# Patient Record
Sex: Female | Born: 1955
Health system: Southern US, Community
[De-identification: ages and names within clinical notes are randomized; demographics above are authoritative.]

## PROBLEM LIST (undated history)

## (undated) DIAGNOSIS — C801 Malignant (primary) neoplasm, unspecified: Secondary | ICD-10-CM

## (undated) DIAGNOSIS — T7840XA Allergy, unspecified, initial encounter: Secondary | ICD-10-CM

## (undated) DIAGNOSIS — H269 Unspecified cataract: Secondary | ICD-10-CM

## (undated) DIAGNOSIS — E785 Hyperlipidemia, unspecified: Secondary | ICD-10-CM

## (undated) DIAGNOSIS — M8580 Other specified disorders of bone density and structure, unspecified site: Secondary | ICD-10-CM

## (undated) HISTORY — DX: Other specified disorders of bone density and structure, unspecified site: M85.80

## (undated) HISTORY — PX: POLYPECTOMY: SHX149

## (undated) HISTORY — PX: BREAST BIOPSY: SHX20

## (undated) HISTORY — DX: Hyperlipidemia, unspecified: E78.5

## (undated) HISTORY — DX: Allergy, unspecified, initial encounter: T78.40XA

## (undated) HISTORY — DX: Malignant (primary) neoplasm, unspecified: C80.1

## (undated) HISTORY — PX: SKIN CANCER EXCISION: SHX779

## (undated) HISTORY — PX: BREAST CYST ASPIRATION: SHX578

## (undated) HISTORY — PX: COLONOSCOPY: SHX174

## (undated) HISTORY — DX: Unspecified cataract: H26.9

---
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
## 1999-10-17 HISTORY — PX: NASAL SINUS SURGERY: SHX719

## 2000-02-27 ENCOUNTER — Other Ambulatory Visit: Admission: RE | Admit: 2000-02-27 | Discharge: 2000-02-27 | Payer: Self-pay | Admitting: Otolaryngology

## 2004-10-16 DIAGNOSIS — C801 Malignant (primary) neoplasm, unspecified: Secondary | ICD-10-CM

## 2004-10-16 HISTORY — DX: Malignant (primary) neoplasm, unspecified: C80.1

## 2011-10-31 ENCOUNTER — Encounter: Payer: Self-pay | Admitting: Family Medicine

## 2011-11-03 LAB — HM MAMMOGRAPHY: HM Mammogram: NORMAL

## 2011-11-14 ENCOUNTER — Encounter: Payer: Self-pay | Admitting: Family Medicine

## 2011-11-14 ENCOUNTER — Other Ambulatory Visit (HOSPITAL_COMMUNITY)
Admission: RE | Admit: 2011-11-14 | Discharge: 2011-11-14 | Disposition: A | Discharge status: Home / Self Care | Payer: BC Managed Care – PPO | Source: Ambulatory Visit | Attending: Family Medicine | Admitting: Family Medicine

## 2011-11-14 ENCOUNTER — Ambulatory Visit (INDEPENDENT_AMBULATORY_CARE_PROVIDER_SITE_OTHER): Payer: BC Managed Care – PPO | Admitting: Family Medicine

## 2011-11-14 VITALS — BP 114/68 | HR 57 | Temp 98.0°F | Ht 64.5 in | Wt 137.8 lb

## 2011-11-14 DIAGNOSIS — Z0000 Encounter for general adult medical examination without abnormal findings: Secondary | ICD-10-CM

## 2011-11-14 DIAGNOSIS — Z8371 Family history of colonic polyps: Secondary | ICD-10-CM

## 2011-11-14 DIAGNOSIS — C4491 Basal cell carcinoma of skin, unspecified: Secondary | ICD-10-CM

## 2011-11-14 DIAGNOSIS — N841 Polyp of cervix uteri: Secondary | ICD-10-CM

## 2011-11-14 DIAGNOSIS — Z01419 Encounter for gynecological examination (general) (routine) without abnormal findings: Secondary | ICD-10-CM | POA: Insufficient documentation

## 2011-11-14 LAB — BASIC METABOLIC PANEL
Calcium: 9.7 mg/dL (ref 8.4–10.5)
Creatinine, Ser: 0.8 mg/dL (ref 0.4–1.2)
GFR: 83.85 mL/min (ref >60.00)

## 2011-11-14 LAB — HEPATIC FUNCTION PANEL
ALT: 12 U/L (ref 0–35)
Alkaline Phosphatase: 41 U/L (ref 39–117)
Bilirubin, Direct: 0 mg/dL (ref 0.0–0.3)
Total Protein: 7.4 g/dL (ref 6.0–8.3)

## 2011-11-14 LAB — CBC WITH DIFFERENTIAL/PLATELET
Basophils Relative: 0.9 % (ref 0.0–3.0)
Eosinophils Relative: 0.8 % (ref 0.0–5.0)
Lymphocytes Relative: 28.9 % (ref 12.0–46.0)
Neutrophils Relative %: 57.7 % (ref 43.0–77.0)
RBC: 4.36 Mil/uL (ref 3.87–5.11)
WBC: 4.5 10*3/uL (ref 4.5–10.5)

## 2011-11-14 LAB — LIPID PANEL
HDL: 96.4 mg/dL (ref >39.00)
Total CHOL/HDL Ratio: 3

## 2011-11-14 LAB — TSH: TSH: 2.08 u[IU]/mL (ref 0.35–5.50)

## 2011-11-14 NOTE — Progress Notes (Signed)
Subjective:     Kaitlyn Perez is a 56 y.o. female and is here for a comprehensive physical exam. The patient reports problems - pt describes several episodes of back catching when bending over.  It resolves on its own but takes several weeks. Marland Kitchen  History   Social History  . Marital Status: Unknown    Spouse Name: Joelynn Dust    Number of Children: N/A  . Years of Education: 46   Occupational History  . exec VP [employer]   Social History Main Topics  . Smoking status: Never Smoker   . Smokeless tobacco: Never Used  . Alcohol Use: 0.0 oz/week     rare--1 x a month  . Drug Use: No  . Sexually Active: Yes -- Female partner(s)   Other Topics Concern  . Not on file   Social History Narrative  . No narrative on file   No health maintenance topics applied.  The following portions of the patient's history were reviewed and updated as appropriate: allergies, current medications, past family history, past medical history, past social history, past surgical history and problem list.  Review of Systems Review of Systems  Constitutional: Negative for activity change, appetite change and fatigue.  HENT: Negative for hearing loss, congestion, tinnitus and ear discharge.  dentist q81m---+ braces Eyes: Negative for visual disturbance (see optho q1y -- vision corrected to 20/20 with glasses).  Respiratory: Negative for cough, chest tightness and shortness of breath.   Cardiovascular: Negative for chest pain, palpitations and leg swelling.  Gastrointestinal: Negative for abdominal pain, diarrhea, constipation and abdominal distention.  Genitourinary: Negative for urgency, frequency, decreased urine volume and difficulty urinating.  Musculoskeletal: Negative for back pain, arthralgias and gait problem.  Skin: Negative for color change, pallor and rash.  Neurological: Negative for dizziness, light-headedness, numbness and headaches.  Hematological: Negative for adenopathy. Does not  bruise/bleed easily.  Psychiatric/Behavioral: Negative for suicidal ideas, confusion, sleep disturbance, self-injury, dysphoric mood, decreased concentration and agitation.       Objective:    BP 114/68  Pulse 57  Temp(Src) 98 F (36.7 C) (Oral)  Ht 5' 4.5" (1.638 m)  Wt 137 lb 12.8 oz (62.506 kg)  BMI 23.29 kg/m2  SpO2 98% General appearance: alert, cooperative, appears stated age and no distress Head: Normocephalic, without obvious abnormality, atraumatic Eyes: conjunctivae/corneas clear. PERRL, EOM's intact. Fundi benign. Ears: normal TM's and external ear canals both ears Nose: Nares normal. Septum midline. Mucosa normal. No drainage or sinus tenderness. Throat: lips, mucosa, and tongue normal; teeth and gums normal Neck: no adenopathy, no carotid bruit, no JVD, supple, symmetrical, trachea midline and thyroid not enlarged, symmetric, no tenderness/mass/nodules Back: symmetric, no curvature. ROM normal. No CVA tenderness. Lungs: clear to auscultation bilaterally Breasts: normal appearance, no masses or tenderness Heart: regular rate and rhythm, S1, S2 normal, no murmur, click, rub or gallop Abdomen: soft, non-tender; bowel sounds normal; no masses,  no organomegaly Pelvic: cervix normal in appearance, external genitalia normal, no adnexal masses or tenderness, no cervical motion tenderness, rectovaginal septum normal, uterus normal size, shape, and consistency, vagina normal without discharge and + cervical polyp removed with ring forceps Extremities: extremities normal, atraumatic, no cyanosis or edema Pulses: 2+ and symmetric Skin: Skin color, texture, turgor normal. No rashes or lesions Lymph nodes: Cervical, supraclavicular, and axillary nodes normal. Neurologic: Alert and oriented X 3, normal strength and tone. Normal symmetric reflexes. Normal coordination and gait psych-- no anxiety or depression    Assessment:  Healthy female exam.  Cervical polyp--- check US  pelvis,  Sent for path     Plan:  ghm utd Check fasting labs   See After Visit Summary for Counseling Recommendations

## 2011-11-14 NOTE — Patient Instructions (Addendum)
Preventative Care for Adults, Female A healthy lifestyle and preventative care can promote health and wellness. Preventative health guidelines for women include the following key practices:  A routine yearly physical is a good way to check with your caregiver about your health and preventative screening. It is a chance to share any concerns and updates on your health, and to receive a thorough exam.   Visit your dentist for a routine exam and preventative care every 6 months. Brush your teeth twice a day and floss once a day. Good oral hygiene prevents tooth decay and gum disease.   The frequency of eye exams is based on your age, health, family medical history, use of contact lenses, and other factors. Follow your caregiver's recommendations for frequency of eye exams.   Eat a healthy diet. Foods like vegetables, fruits, whole grains, low-fat dairy products, and lean protein foods contain the nutrients you need without too many calories. Decrease your intake of foods high in solid fats, added sugars, and salt. Eat the right amount of calories for you.Get information about a proper diet from your caregiver, if necessary.   Regular physical exercise is one of the most important things you can do for your health. Most adults should get at least 150 minutes of moderate-intensity exercise (any activity that increases your heart rate and causes you to sweat) each week. In addition, most adults need muscle-strengthening exercises on 2 or more days a week.   Maintain a healthy weight. The body mass index (BMI) is a screening tool to identify possible weight problems. It provides an estimate of body fat based on height and weight. Your caregiver can help determine your BMI, and can help you achieve or maintain a healthy weight.For adults 20 years and older:   A BMI below 18.5 is considered underweight.   A BMI of 18.5 to 24.9 is normal.   A BMI of 25 to 29.9 is considered overweight.   A BMI of 30 and  above is considered obese.   Maintain normal blood lipids and cholesterol levels by exercising and minimizing your intake of saturated fat. Eat a balanced diet with plenty of fruit and vegetables. Blood tests for lipids and cholesterol should begin at age 20 and be repeated every 5 years. If your lipid or cholesterol levels are high, you are over 50, or you are a high risk for heart disease, you may need your cholesterol levels checked more frequently.Ongoing high lipid and cholesterol levels should be treated with medicines if diet and exercise are not effective.   If you smoke, find out from your caregiver how to quit. If you do not use tobacco, do not start.   If you are pregnant, do not drink alcohol. If you are breastfeeding, be very cautious about drinking alcohol. If you are not pregnant and choose to drink alcohol, do not exceed 1 drink per day. One drink is considered to be 12 ounces (355 mL) of beer, 5 ounces (148 mL) of wine, or 1.5 ounces (44 mL) of liquor.   Avoid use of street drugs. Do not share needles with anyone. Ask for help if you need support or instructions about stopping the use of drugs.   High blood pressure causes heart disease and increases the risk of stroke. Your blood pressure should be checked at least every 1 to 2 years. Ongoing high blood pressure should be treated with medicines if weight loss and exercise are not effective.   If you are 55 to 56   years old, ask your caregiver if you should take aspirin to prevent strokes.   Diabetes screening involves taking a blood sample to check your fasting blood sugar level. This should be done once every 3 years, after age 45, if you are within normal weight and without risk factors for diabetes. Testing should be considered at a younger age or be carried out more frequently if you are overweight and have at least 1 risk factor for diabetes.   Breast cancer screening is essential preventative care for women. You should  practice "breast self-awareness." This means understanding the normal appearance and feel of your breasts and may include breast self-examination. Any changes detected, no matter how small, should be reported to a caregiver. Women in their 20s and 30s should have a clinical breast exam (CBE) by a caregiver as part of a regular health exam every 1 to 3 years. After age 40, women should have a CBE every year. Starting at age 40, women should consider having a mammogram (breast X-ray) every year. Women who have a family history of breast cancer should talk to their caregiver about genetic screening. Women at a high risk of breast cancer should talk to their caregiver about having an MRI and a mammogram every year.   The Pap test is a screening test for cervical cancer. A Pap test can show cell changes on the cervix that might become cervical cancer if left untreated. A Pap test is a procedure in which cells are obtained and examined from the lower end of the uterus (cervix).   Women should have a Pap test starting at age 21.   Between ages 21 and 29, Pap tests should be repeated every 2 years.   Beginning at age 30, you should have a Pap test every 3 years as long as the past 3 Pap tests have been normal.   Some women have medical problems that increase the chance of getting cervical cancer. Talk to your caregiver about these problems. It is especially important to talk to your caregiver if a new problem develops soon after your last Pap test. In these cases, your caregiver may recommend more frequent screening and Pap tests.   The above recommendations are the same for women who have or have not gotten the vaccine for human papillomavirus (HPV).   If you had a hysterectomy for a problem that was not cancer or a condition that could lead to cancer, then you no longer need Pap tests. Even if you no longer need a Pap test, a regular exam is a good idea to make sure no other problems are starting.   If you  are between ages 65 and 70, and you have had normal Pap tests going back 10 years, you no longer need Pap tests. Even if you no longer need a Pap test, a regular exam is a good idea to make sure no other problems are starting.   If you have had past treatment for cervical cancer or a condition that could lead to cancer, you need Pap tests and screening for cancer for at least 20 years after your treatment.   If Pap tests have been discontinued, risk factors (such as a new sexual partner) need to be reassessed to determine if screening should be resumed.   The HPV test is an additional test that may be used for cervical cancer screening. The HPV test looks for the virus that can cause the cell changes on the cervix. The cells collected   during the Pap test can be tested for HPV. The HPV test could be used to screen women aged 30 years and older, and should be used in women of any age who have unclear Pap test results. After the age of 30, women should have HPV testing at the same frequency as a Pap test.   Colorectal cancer can be detected and often prevented. Most routine colorectal cancer screening begins at the age of 50 and continues through age 75. However, your caregiver may recommend screening at an earlier age if you have risk factors for colon cancer. On a yearly basis, your caregiver may provide home test kits to check for hidden blood in the stool. Use of a small camera at the end of a tube, to directly examine the colon (sigmoidoscopy or colonoscopy), can detect the earliest forms of colorectal cancer. Talk to your caregiver about this at age 50, when routine screening begins. Direct examination of the colon should be repeated every 5 to 10 years through age 75, unless early forms of pre-cancerous polyps or small growths are found.   Practice safe sex. Use condoms and avoid high-risk sexual practices to reduce the spread of sexually transmitted infections (STIs). STIs include gonorrhea,  chlamydia, syphilis, trichomonas, herpes, HPV, and human immunodeficiency virus (HIV). Herpes, HIV, and HPV are viral illnesses that have no cure. They can result in disability, cancer, and death. Sexually active women aged 25 and younger should be checked for Chlamydia. Older women with new or multiple partners should also be tested for Chlamydia. Testing for other STIs is recommended if you are sexually active and at increased risk.   Osteoporosis is a disease in which the bones lose minerals and strength with aging. This can result in serious bone fractures. The risk of osteoporosis can be identified using a bone density scan. Women ages 65 and over and women at risk for fractures or osteoporosis should discuss screening with their caregivers. Ask your caregiver whether you should take a calcium supplement or vitamin D to reduce the rate of osteoporosis.   Menopause can be associated with physical symptoms and risks. Hormone replacement therapy is available to decrease symptoms and risks. You should talk to your caregiver about whether hormone replacement therapy is right for you.   Use sunscreen with skin protection factor (SPF) of 30 or more. Apply sunscreen liberally and repeatedly throughout the day. You should seek shade when your shadow is shorter than you. Protect yourself by wearing long sleeves, pants, a wide-brimmed hat, and sunglasses year round, whenever you are outdoors.   Once a month, do a whole body skin exam, using a mirror to look at the skin on your back. Notify your caregiver of new moles, moles that have irregular borders, moles that are larger than a pencil eraser, or moles that have changed in shape or color.   Stay current with required immunizations.   Influenza. You need a dose every fall (or winter). The composition of the flu vaccine changes each year, so being vaccinated once is not enough.   Pneumococcal polysaccharide. You need 1 to 2 doses if you smoke cigarettes or  if you have certain chronic medical conditions. You need 1 dose at age 65 (or older) if you have never been vaccinated.   Tetanus, diphtheria, pertussis (Tdap, Td). Get 1 dose of Tdap vaccine if you are younger than age 65 years, are over 65 and have contact with an infant, are a healthcare worker, are pregnant, or simply want   to be protected from whooping cough. After that, you need a Td booster dose every 10 years. Consult your caregiver if you have not had at least 3 tetanus and diphtheria-containing shots sometime in your life or have a deep or dirty wound.   HPV. You need this vaccine if you are a woman age 26 years or younger. The vaccine is given in 3 doses over 6 months.   Measles, mumps, rubella (MMR). You need at least 1 dose of MMR if you were born in 1957 or later. You may also need a 2nd dose.   Meningococcal. If you are age 19 to 21 years and a first-year college student living in a residence hall, or have one of several medical conditions, you need to get vaccinated against meningococcal disease. You may also need additional booster doses.   Zoster (shingles). If you are age 60 years or older, you should get this vaccine.   Varicella (chickenpox). If you have never had chickenpox or you were vaccinated but received only 1 dose, talk to your caregiver to find out if you need this vaccine.   Hepatitis A. You need this vaccine if you have a specific risk factor for hepatitis A virus infection or you simply wish to be protected from this disease. The vaccine is usually given as 2 doses, 6 to 18 months apart.   Hepatitis B. You need this vaccine if you have a specific risk factor for hepatitis B virus infection or you simply wish to be protected from this disease. The vaccine is given in 3 doses, usually over 6 months.  Preventative Services / Frequency Ages 19 to 39  Blood pressure check.** / Every 1 to 2 years.   Lipid and cholesterol check.**/ Every 5 years beginning at age 20.    Clinical breast exam.** / Every 3 years for women in their 20s and 30s.   Pap Test.** / Every 2 years from ages 21 through 29. Every 3 years starting at age 30 years through age 65 or 70 with a history of 3 consecutive normal Pap tests.   HPV Screening.** / Every 3 years from ages 30 through ages 65 to 70 with a history of 3 consecutive normal Pap tests.   Skin self-exam. / Monthly.   Influenza immunization.** / Every year.   Pneumococcal polysaccharide immunization.** / 1 to 2 doses if you smoke cigarettes or if you have certain chronic medical conditions.   Tetanus, diphtheria, pertussis (Tdap,Td) immunization. / A one-time dose of Tdap vaccine. After that, you need a Td booster dose every 10 years.   HPV immunization. / 3 doses over 6 months, if 26 and younger.   Measles, mumps, rubella (MMR) immunization. / You need at least 1 dose of MMR if you were born in 1957 or later. You may also need a 2nd dose.   Meningococcal immunization. / 1 dose if you are age 19 to 21 years and a first-year college student living in a residence hall, or have one of several medical conditions, you need to get vaccinated against meningococcal disease. You may also need additional booster doses.   Varicella immunization. **/ Consult your caregiver.   Hepatitis A immunization. ** / Consult your caregiver. 2 doses, 6 to 18 months apart.   Hepatitis B immunization.** / Consult your caregiver. 3 doses usually over 6 months.  Ages 40 to 64  Blood pressure check.** / Every 1 to 2 years.   Lipid and cholesterol check.**/ Every 5 years beginning   at age 20.   Clinical breast exam.** / Every year after age 40.   Mammogram.** / Every year beginning at age 40 and continuing for as long as you are in good health. Consult with your caregiver.   Pap Test.** / Every 3 years starting at age 30 years through age 65 or 70 with a history of 3 consecutive normal Pap tests.   HPV Screening.** / Every 3 years from  ages 30 through ages 65 to 70 with a history of 3 consecutive normal Pap tests.   Fecal occult blood test (FOBT) of stool. / Every year beginning at age 50 and continuing until age 75. You may not have to do this test if you get colonoscopy every 10 years.   Flexible sigmoidoscopy** or colonoscopy.** / Every 5 years for a flexible sigmoidoscopy or every 10 years for a colonoscopy beginning at age 50 and continuing until age 75.   Skin self-exam. / Monthly.   Influenza immunization.** / Every year.   Pneumococcal polysaccharide immunization.** / 1 to 2 doses if you smoke cigarettes or if you have certain chronic medical conditions.   Tetanus, diphtheria, pertussis (Tdap/Td) immunization.** / A one-time dose of Tdap vaccine. After that, you need a Td booster dose every 10 years.   Measles, mumps, rubella (MMR) immunization. / You need at least 1 dose of MMR if you were born in 1957 or later. You may also need a 2nd dose.   Varicella immunization. **/ Consult your caregiver.   Meningococcal immunization.** / Consult your caregiver.     Hepatitis A immunization. ** / Consult your caregiver. 2 doses, 6 to 18 months apart.   Hepatitis B immunization.** / Consult your caregiver. 3 doses, usually over 6 months.  Ages 65 and over  Blood pressure check.** / Every 1 to 2 years.   Lipid and cholesterol check.**/ Every 5 years beginning at age 20.   Clinical breast exam.** / Every year after age 40.   Mammogram.** / Every year beginning at age 40 and continuing for as long as you are in good health. Consult with your caregiver.   Pap Test,** / Every 3 years starting at age 30 years through age 65 or 70 with a 3 consecutive normal Pap tests. Testing can be stopped between 65 and 70 with 3 consecutive normal Pap tests and no abnormal Pap or HPV tests in the past 10 years.   HPV Screening.** / Every 3 years from ages 30 through ages 65 or 70 with a history of 3 consecutive normal Pap tests.  Testing can be stopped between 65 and 70 with 3 consecutive normal Pap tests and no abnormal Pap or HPV tests in the past 10 years.   Fecal occult blood test (FOBT) of stool. / Every year beginning at age 50 and continuing until age 75. You may not have to do this test if you get colonoscopy every 10 years.   Flexible sigmoidoscopy** or colonoscopy.** / Every 5 years for a flexible sigmoidoscopy or every 10 years for a colonoscopy beginning at age 50 and continuing until age 75.   Osteoporosis screening.** / A one-time screening for women ages 65 and over and women at risk for fractures or osteoporosis.   Skin self-exam. / Monthly.   Influenza immunization.** / Every year.   Pneumococcal polysaccharide immunization.** / 1 dose at age 65 (or older) if you have never been vaccinated.   Tetanus, diphtheria, pertussis (Tdap, Td) immunization. / A one-time dose of Tdap   vaccine if you are over 65 and have contact with an infant, are a Research scientist (physical sciences), or simply want to be protected from whooping cough. After that, you need a Td booster dose every 10 years.   Varicella immunization. **/ Consult your caregiver.   Meningococcal immunization.** / Consult your caregiver.   Hepatitis A immunization. ** / Consult your caregiver. 2 doses, 6 to 18 months apart.   Hepatitis B immunization.** / Check with your caregiver. 3 doses, usually over 6 months.  ** Family history and personal history of risk and conditions may change your caregiver's recommendations. Document Released: 11/28/2001 Document Revised: 06/14/2011 Document Reviewed: 02/27/2011 [facility] Patient Information 2012 [place], [place].   Back Exercises Back exercises help treat and prevent back injuries. The goal of back exercises is to increase the strength of your abdominal and back muscles and the flexibility of your back. These exercises should be started when you no longer have back pain. Back exercises include:  Pelvic Tilt. Lie on  your back with your knees bent. Tilt your pelvis until the lower part of your back is against the floor. Hold this position 5 to 10 sec and repeat 5 to 10 times.   Knee to Chest. Pull first 1 knee up against your chest and hold for 20 to 30 seconds, repeat this with the other knee, and then both knees. This may be done with the other leg straight or bent, whichever feels better.   Sit-Ups or Curl-Ups. Bend your knees 90 degrees. Start with tilting your pelvis, and do a partial, slow sit-up, lifting your trunk only 30 to 45 degrees off the floor. Take at least 2 to 3 seconds for each sit-up. Do not do sit-ups with your knees out straight. If partial sit-ups are difficult, simply do the above but with only tightening your abdominal muscles and holding it as directed.   Hip-Lift. Lie on your back with your knees flexed 90 degrees. Push down with your feet and shoulders as you raise your hips a couple inches off the floor; hold for 10 seconds, repeat 5 to 10 times.   Back arches. Lie on your stomach, propping yourself up on bent elbows. Slowly press on your hands, causing an arch in your low back. Repeat 3 to 5 times. Any initial stiffness and discomfort should lessen with repetition over time.   Shoulder-Lifts. Lie face down with arms beside your body. Keep hips and torso pressed to floor as you slowly lift your head and shoulders off the floor.  Do not overdo your exercises, especially in the beginning. Exercises may cause you some mild back discomfort which lasts for a few minutes; however, if the pain is more severe, or lasts for more than 15 minutes, do not continue exercises until you see your caregiver. Improvement with exercise therapy for back problems is slow.  See your caregivers for assistance with developing a proper back exercise program. Document Released: 11/09/2004 Document Revised: 05/31/2011 Document Reviewed: 10/02/2005 [facility] Patient Information 2012 [place], [place].

## 2011-11-14 NOTE — Assessment & Plan Note (Signed)
Check US pelvis

## 2011-11-17 ENCOUNTER — Other Ambulatory Visit (HOSPITAL_BASED_OUTPATIENT_CLINIC_OR_DEPARTMENT_OTHER): Payer: BC Managed Care – PPO

## 2011-11-24 ENCOUNTER — Encounter: Payer: Self-pay | Admitting: Family Medicine

## 2011-11-24 ENCOUNTER — Ambulatory Visit (HOSPITAL_BASED_OUTPATIENT_CLINIC_OR_DEPARTMENT_OTHER)
Admission: RE | Admit: 2011-11-24 | Discharge: 2011-11-24 | Disposition: A | Discharge status: Home / Self Care | Payer: BC Managed Care – PPO | Source: Ambulatory Visit | Attending: Family Medicine | Admitting: Family Medicine

## 2011-11-24 ENCOUNTER — Other Ambulatory Visit: Payer: Self-pay | Admitting: Family Medicine

## 2011-11-24 ENCOUNTER — Ambulatory Visit (INDEPENDENT_AMBULATORY_CARE_PROVIDER_SITE_OTHER)
Admission: RE | Admit: 2011-11-24 | Discharge: 2011-11-24 | Disposition: A | Discharge status: Home / Self Care | Payer: BC Managed Care – PPO | Source: Ambulatory Visit | Attending: Family Medicine | Admitting: Family Medicine

## 2011-11-24 DIAGNOSIS — N841 Polyp of cervix uteri: Secondary | ICD-10-CM

## 2011-11-24 DIAGNOSIS — N912 Amenorrhea, unspecified: Secondary | ICD-10-CM

## 2011-11-24 DIAGNOSIS — E875 Hyperkalemia: Secondary | ICD-10-CM

## 2011-11-27 ENCOUNTER — Other Ambulatory Visit (INDEPENDENT_AMBULATORY_CARE_PROVIDER_SITE_OTHER): Payer: BC Managed Care – PPO

## 2011-11-27 DIAGNOSIS — E875 Hyperkalemia: Secondary | ICD-10-CM

## 2011-11-27 LAB — BASIC METABOLIC PANEL
BUN: 18 mg/dL (ref 6–23)
CO2: 26 mEq/L (ref 19–32)
Calcium: 9.3 mg/dL (ref 8.4–10.5)
Creatinine, Ser: 0.7 mg/dL (ref 0.4–1.2)
GFR: 96.96 mL/min (ref >60.00)
Glucose, Bld: 85 mg/dL (ref 70–99)
Sodium: 139 mEq/L (ref 135–145)

## 2011-12-25 ENCOUNTER — Encounter: Payer: Self-pay | Admitting: Family Medicine

## 2012-03-15 ENCOUNTER — Encounter: Payer: Self-pay | Admitting: Gastroenterology

## 2012-04-17 ENCOUNTER — Ambulatory Visit (INDEPENDENT_AMBULATORY_CARE_PROVIDER_SITE_OTHER): Payer: BC Managed Care – PPO | Admitting: Internal Medicine

## 2012-04-17 ENCOUNTER — Telehealth: Payer: Self-pay | Admitting: Family Medicine

## 2012-04-17 ENCOUNTER — Encounter: Payer: Self-pay | Admitting: Internal Medicine

## 2012-04-17 VITALS — BP 114/68 | HR 58 | Temp 98.2°F | Wt 139.4 lb

## 2012-04-17 DIAGNOSIS — J310 Chronic rhinitis: Secondary | ICD-10-CM

## 2012-04-17 DIAGNOSIS — J209 Acute bronchitis, unspecified: Secondary | ICD-10-CM

## 2012-04-17 MED ORDER — AZITHROMYCIN 250 MG PO TABS: ORAL_TABLET | ORAL | Status: AC

## 2012-04-17 MED ORDER — HYDROCODONE-HOMATROPINE 5-1.5 MG/5ML PO SYRP: 5.0000 mL | ORAL_SOLUTION | Freq: Four times a day (QID) | ORAL | Status: AC | PRN

## 2012-04-17 MED ORDER — FLUTICASONE PROPIONATE 50 MCG/ACT NA SUSP: 1.0000 | Freq: Two times a day (BID) | NASAL | Status: DC | PRN

## 2012-04-17 NOTE — Telephone Encounter (Signed)
Apt today with Hop at 1pm.

## 2012-04-17 NOTE — Telephone Encounter (Signed)
Caller: Kirin/Patient; PCP: Lelon Perla.; CB#: [phone];  Call regarding Cough/Congestion; Afebrile/tactile.  Relates she has appt. w/ Dr. Alwyn Ren at 1300. Sinus pain. Emergent sx ruled out.  Advised see in 24 hours d/t gradual onset of cough when lying down AND havig to sleep on more than one pillow.   Home care for the interim and parameters for callback per Cough protocol.

## 2012-04-17 NOTE — Progress Notes (Signed)
  Subjective:    Patient ID: Kaitlyn Perez, female    DOB: [birth year], 56 y.o.   MRN: [id number]  HPI 2 weeks ago she was exposed to her husband's  respiratory tract infection . Initial symptoms were sore throat followed by rhinitis and sinus congestion. She has noticed an odor of something "burnt"  This has progressed to a cough which was  productive of yellow sputum initially but now only clear sputum. She has had some sneezing. Symptoms have been partially responsive to the day/night over-the-counter sinus preparations.  He she has had some facial pain; initially nasal secretions were purulent but now clear. She's had some itching in  Her ears but no pain or discharge  She has no history of asthma but did have chronic recurrent sinusitis until she had sinus surgery 12 years ago    Review of Systems  She has not had frontal headache or dental pain other than that related to her braces     Objective:   Physical Exam General appearance:good health ;well nourished; no acute distress or increased work of breathing is present.  No  lymphadenopathy about the head, neck, or axilla noted.   Eyes: No conjunctival inflammation or lid edema is present.   Ears:  External ear exam shows no significant lesions or deformities.  Otoscopic examination reveals clear canals, tympanic membranes are intact bilaterally without bulging, retraction, inflammation or discharge.  Nose:  External nasal examination shows no deformity or inflammation. Nasal mucosa are pink and moist without lesions or exudates. No septal dislocation or deviation.No obstruction to airflow.   Oral exam: Dental hygiene is good; lips and gums are healthy appearing.There is no oropharyngeal erythema or exudate noted.  Braces   Heart:  Normal rate and regular rhythm. S1 and S2 normal without gallop, murmur, click, rub or other extra sounds.   Lungs:Chest clear to auscultation; no wheezes, rhonchi,rales ,or rubs present.No increased work of  breathing.  Dry cough  Extremities:  No cyanosis, edema, or clubbing  noted    Skin: Warm & dry          Assessment & Plan:

## 2012-04-17 NOTE — Patient Instructions (Addendum)
Plain Mucinex for thick secretions ;force NON dairy fluids . Use a Neti pot daily as needed for sinus congestion; going from open side to congested side . Nasal cleansing in the shower as discussed. Make sure that all residual soap is removed to prevent irritation. Fluticasone 1 spray in each nostril twice a day as needed. Use the "crossover" technique as discussed. Plain Allegra 160 daily as needed for itchy eyes & sneezing.    

## 2012-08-16 HISTORY — PX: MANDIBLE SURGERY: SHX707

## 2012-08-16 IMAGING — US US PELVIS COMPLETE
1 series · 14 of 25 positions shown · non-contrast
Comparison: None.

CLINICAL DATA: Cervical polyp.  Amenorrhea.

TRANSABDOMINAL AND TRANSVAGINAL ULTRASOUND OF PELVIS
TECHNIQUE: Both transabdominal and transvaginal ultrasound
examinations of the pelvis were performed. Transabdominal technique
was performed for global imaging of the pelvis including uterus,
ovaries, adnexal regions, and pelvic cul-de-sac.

[Series 1: us pelvis complete · 0.26mm/px · 14 of 66 slices shown]
[im 1/66]
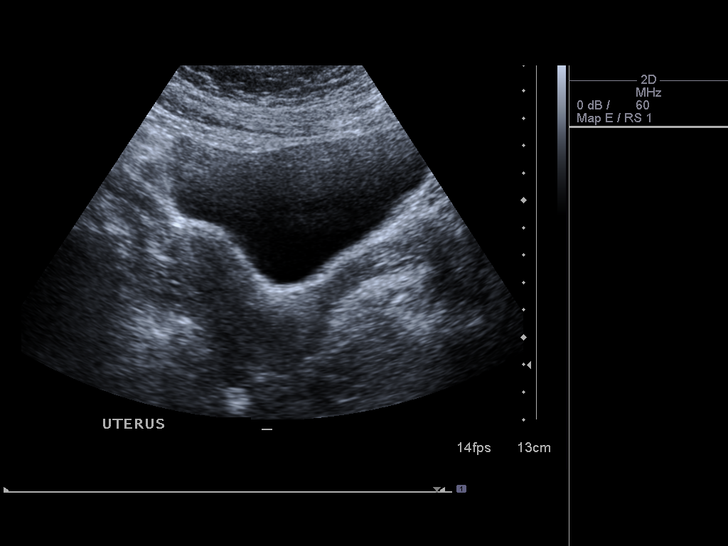
[im 6/66]
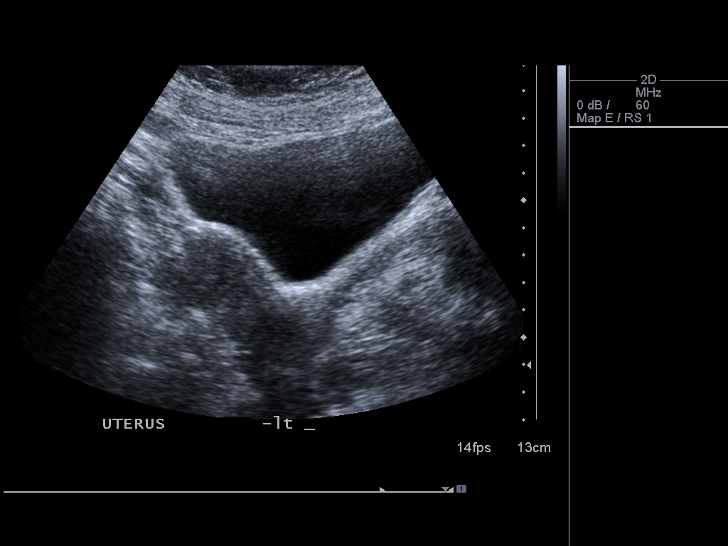
[im 11/66]
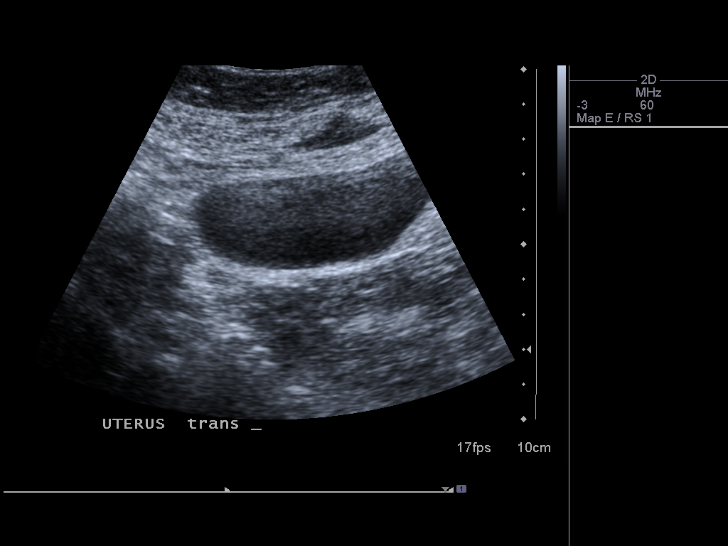
[im 17/66]
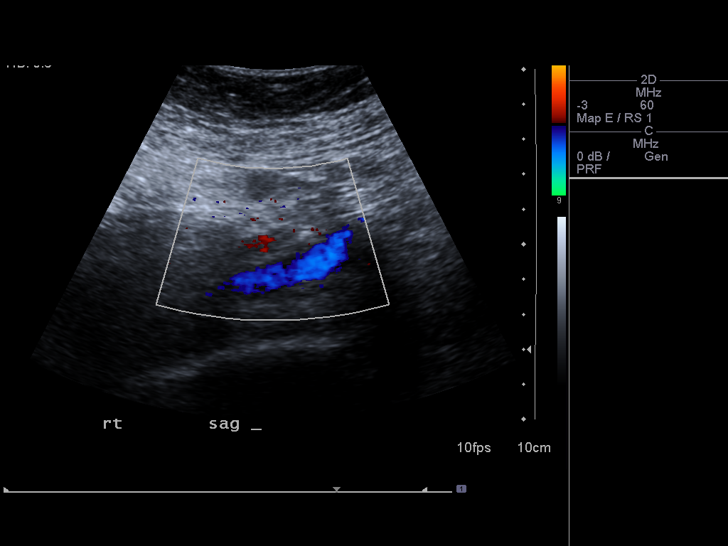
[im 22/66]
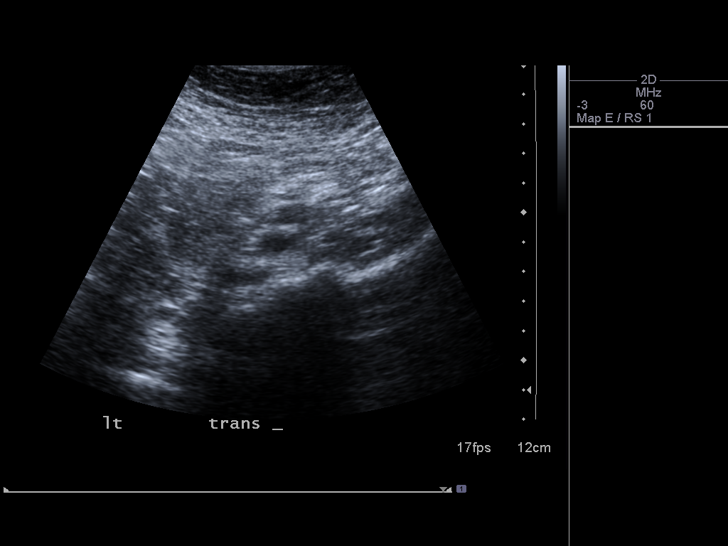
[im 25/66]
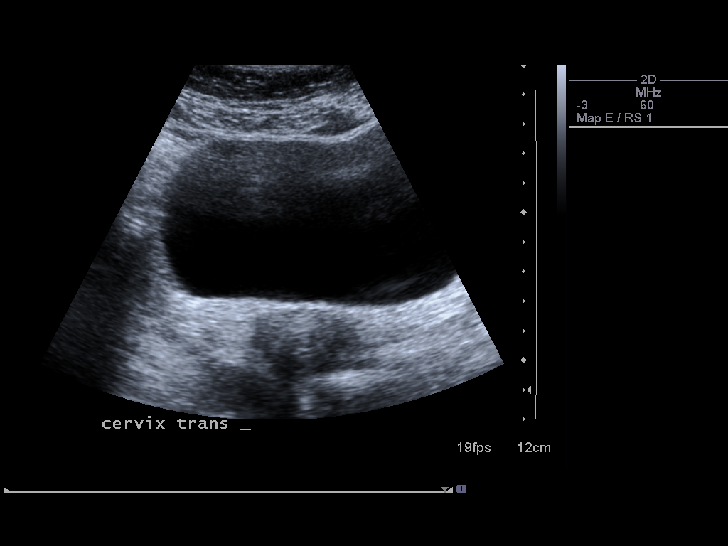
[im 30/66]
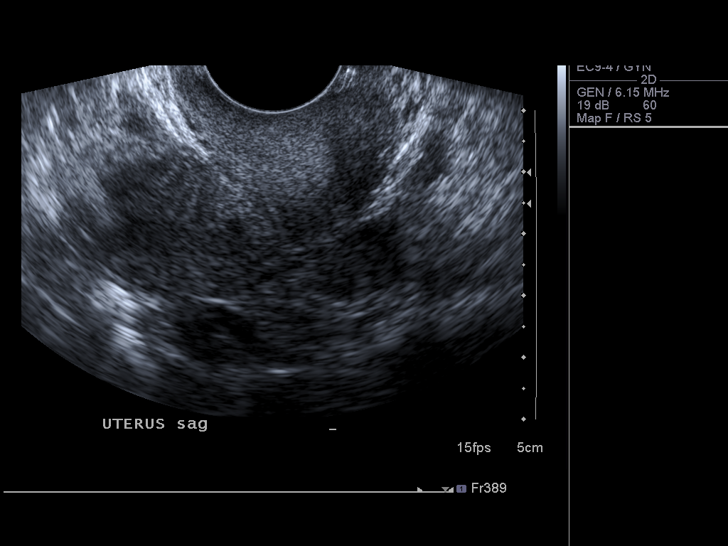
[im 36/66]
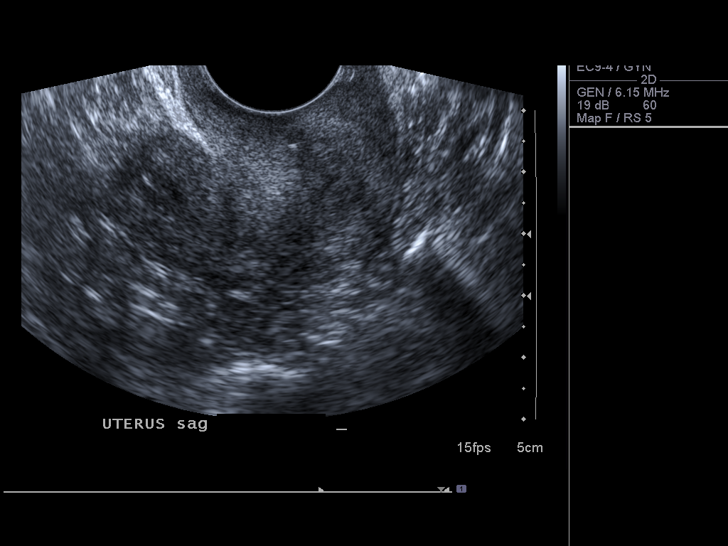
[im 41/66]
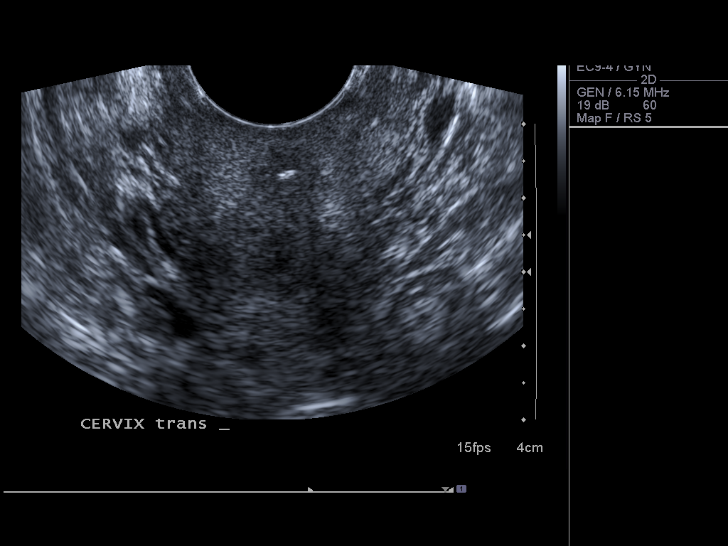
[im 44/66]
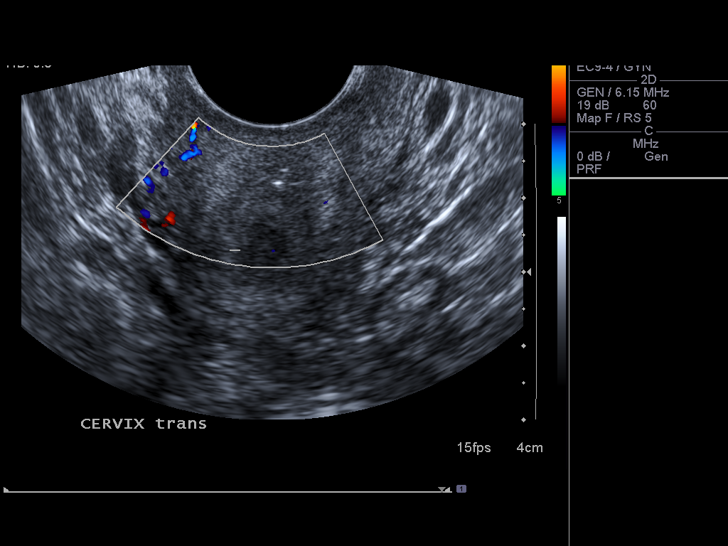
[im 49/66]
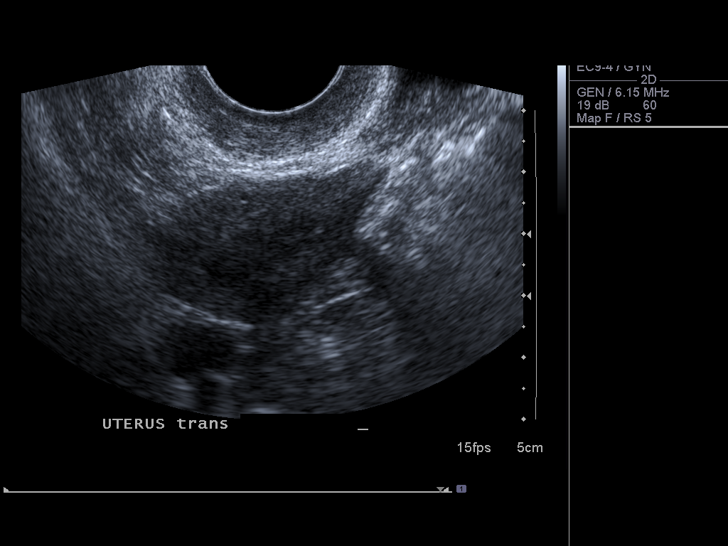
[im 55/66]
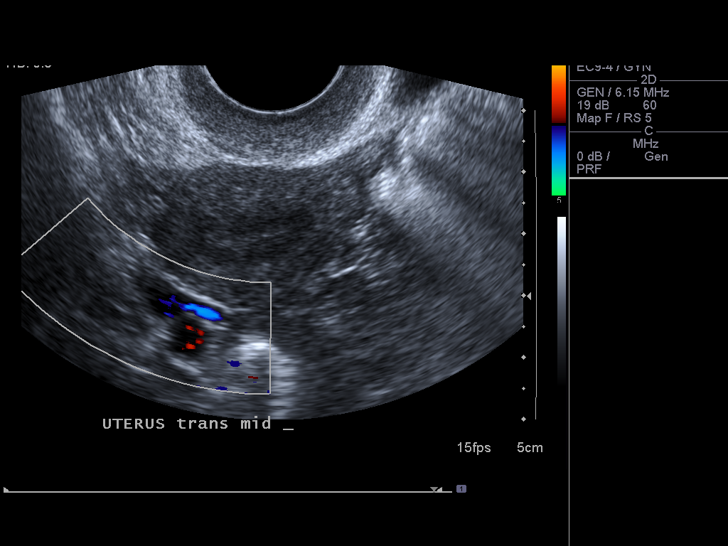
[im 60/66]
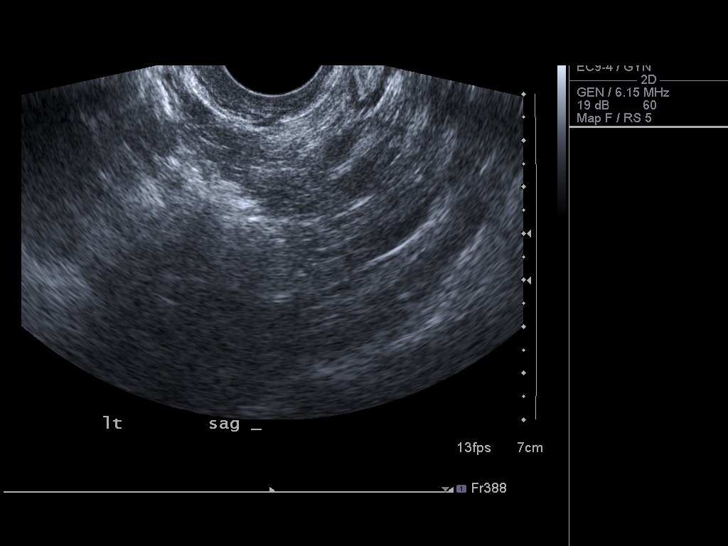
[im 66/66]
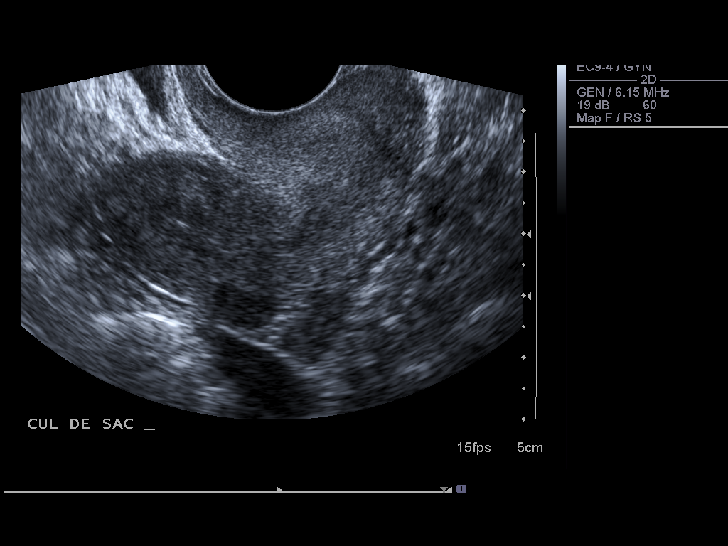

[14 of 25 positions shown; findings below may reference images not displayed]

It was necessary to proceed with endovaginal exam following the
transabdominal exam to visualize the ovaries and endometrium..
FINDINGS: Uterus: Measures 6.7 x 2.7 x 4.3 cm.

Endometrium: Normal in thickness measuring a maximum of 2 mm.

Right ovary:  Not visualized.

Left ovary: Not visualized.

Other findings: No adnexal masses or free pelvic fluid collections.
IMPRESSION: 1.  Normal sonographic appearance of the uterus.
2.  The ovaries could not be visualized.  MR Darcey be helpful for
further evaluation if clinically indicated..

## 2012-11-11 ENCOUNTER — Telehealth: Payer: Self-pay | Admitting: Family Medicine

## 2012-11-11 MED ORDER — ESTROGENS, CONJUGATED 0.625 MG/GM VA CREA: 1.0000 g | TOPICAL_CREAM | VAGINAL | Status: DC

## 2012-11-11 NOTE — Telephone Encounter (Signed)
Refill: Premarin vag refill 0.625 mg/gm vaginal c #42.5. Use one/half (1/2) in vagina two times a week.

## 2012-11-15 ENCOUNTER — Ambulatory Visit: Payer: BC Managed Care – PPO | Admitting: Family Medicine

## 2012-11-21 ENCOUNTER — Encounter: Payer: Self-pay | Admitting: Family Medicine

## 2012-11-21 ENCOUNTER — Ambulatory Visit (INDEPENDENT_AMBULATORY_CARE_PROVIDER_SITE_OTHER): Payer: BC Managed Care – PPO | Admitting: Family Medicine

## 2012-11-21 VITALS — BP 116/72 | HR 54 | Temp 97.8°F | Wt 133.4 lb

## 2012-11-21 DIAGNOSIS — B079 Viral wart, unspecified: Secondary | ICD-10-CM

## 2012-11-21 NOTE — Patient Instructions (Signed)
Warts Warts are a common viral infection. They are most commonly caused by the human papillomavirus (HPV). Warts can occur at all ages. However, they occur most frequently in older children and infrequently in the elderly. Warts may be single or multiple. Location and size varies. Warts can be spread by scratching the wart and then scratching normal skin. The life cycle of warts varies. However, most will disappear over many months to a couple years. Warts commonly do not cause problems (asymptomatic) unless they are over an area of pressure, such as the bottom of the foot. If they are large enough, they may cause pain with walking. DIAGNOSIS  Warts are most commonly diagnosed by their appearance. Tissue samples (biopsies) are not required unless the wart looks abnormal. Most warts have a rough surface, are round, oval, or irregular, and are skin-colored to light yellow, brown, or gray. They are generally less than  inch (1.3 cm), but they can be any size. TREATMENT   Observation or no treatment.  Freezing with liquid nitrogen.  High heat (cautery).  Boosting the body's immunity to fight off the wart (immunotherapy using Candida antigen).  Laser surgery.  Application of various irritants and solutions. HOME CARE INSTRUCTIONS  Follow your caregiver's instructions. No special precautions are necessary. Often, treatment may be followed by a return (recurrence) of warts. Warts are generally difficult to treat and get rid of. If treatment is done in a clinic setting, usually more than 1 treatment is required. This is usually done on only a monthly basis until the wart is completely gone. SEEK IMMEDIATE MEDICAL CARE IF: The treated skin becomes red, puffy (swollen), or painful. Document Released: 07/12/2005 Document Revised: 12/25/2011 Document Reviewed: 01/07/2010 [employer] Patient Information 2013 [employer].  

## 2012-11-21 NOTE — Progress Notes (Signed)
  Subjective:    Kaitlyn Perez is a 57 y.o. female who complains of warts. The warts are located on hands b/l . They have been present for several weeks. The patient denies pain or cellulitic infection symptoms.  The following portions of the patient's history were reviewed and updated as appropriate:  She  has a past medical history of Cancer (2006). She  does not have any pertinent problems on file. She  has past surgical history that includes Nasal sinus surgery (2001) and Tonsillectomy and adenoidectomy (1967). Her family history includes Arthritis in her mother; Breast cancer in her maternal aunt and unspecified family member; Cancer in her brother; Cancer (age of onset:70) in her mother; Colon cancer in her paternal uncle; Diabetes in her father; Heart disease (age of onset:65) in her maternal grandfather; Heart disease (age of onset:77) in her maternal grandmother; Hyperlipidemia in her father and mother; Hypertension in her father; Hypotension in her mother; Lung cancer in her paternal uncle; and Uterine cancer in her paternal aunt. She  reports that she has never smoked. She has never used smokeless tobacco. She reports that she drinks alcohol. She reports that she does not use illicit drugs. She has a current medication list which includes the following prescription(s): conjugated estrogens. Current Outpatient Prescriptions on File Prior to Visit  Medication Sig Dispense Refill  . conjugated estrogens (PREMARIN) vaginal cream Place 0.25 Applicatorfuls vaginally 2 (two) times a week.  42.5 g  0   She is allergic to neosporin..  Review of Systems Pertinent items are noted in HPI.    Objective:    Skin: multiple tiny  warts noted on both hands. Size range is 5 mm. ---to much smaller   Assessment:    Warts (Verruca Vulgaris)    Plan:    1. The viral etiology and natural history has been discussed.  2. Various treatment methods, side effects and failure rates have been discussed.    3. A choice of liquid nitrogen was made, and the expected blistering or scabbing reaction explained. 4. Liquid nitrogen was applied to 10 warts for 15 second freeze/thaw cycles. 5. The patient will return at 2-4 week intervals for retreatment's as needed.

## 2012-12-02 ENCOUNTER — Encounter: Payer: Self-pay | Admitting: Family Medicine

## 2013-01-09 ENCOUNTER — Ambulatory Visit (INDEPENDENT_AMBULATORY_CARE_PROVIDER_SITE_OTHER): Payer: BC Managed Care – PPO | Admitting: Family Medicine

## 2013-01-09 ENCOUNTER — Ambulatory Visit: Payer: BC Managed Care – PPO

## 2013-01-09 ENCOUNTER — Encounter: Payer: Self-pay | Admitting: Family Medicine

## 2013-01-09 VITALS — BP 114/60 | HR 62 | Temp 97.9°F | Ht 65.0 in | Wt 132.0 lb

## 2013-01-09 DIAGNOSIS — Z0000 Encounter for general adult medical examination without abnormal findings: Secondary | ICD-10-CM

## 2013-01-09 DIAGNOSIS — Z23 Encounter for immunization: Secondary | ICD-10-CM

## 2013-01-09 LAB — BASIC METABOLIC PANEL
GFR: 96.57 mL/min (ref >60.00)
Potassium: 3.4 mEq/L — ABNORMAL LOW (ref 3.5–5.1)
Sodium: 139 mEq/L (ref 135–145)

## 2013-01-09 LAB — CBC WITH DIFFERENTIAL/PLATELET
Eosinophils Relative: 1 % (ref 0.0–5.0)
HCT: 42.3 % (ref 36.0–46.0)
Hemoglobin: 14.2 g/dL (ref 12.0–15.0)
Lymphs Abs: 1.1 10*3/uL (ref 0.7–4.0)
Monocytes Relative: 12 % (ref 3.0–12.0)
Neutro Abs: 2.3 10*3/uL (ref 1.4–7.7)
Platelets: 256 10*3/uL (ref 150.0–400.0)
WBC: 4 10*3/uL — ABNORMAL LOW (ref 4.5–10.5)

## 2013-01-09 LAB — HEPATIC FUNCTION PANEL
ALT: 18 U/L (ref 0–35)
AST: 18 U/L (ref 0–37)
Alkaline Phosphatase: 44 U/L (ref 39–117)
Total Bilirubin: 0.8 mg/dL (ref 0.3–1.2)

## 2013-01-09 LAB — VITAMIN B12: Vitamin B-12: 1326 pg/mL — ABNORMAL HIGH (ref 211–911)

## 2013-01-09 LAB — LIPID PANEL
Cholesterol: 279 mg/dL — ABNORMAL HIGH (ref 0–200)
Total CHOL/HDL Ratio: 3

## 2013-01-09 LAB — LDL CHOLESTEROL, DIRECT: Direct LDL: 152.7 mg/dL

## 2013-01-09 NOTE — Addendum Note (Signed)
Addended by: Lelon Perla on: 01/09/2013 01:01 PM   Modules accepted: Orders

## 2013-01-09 NOTE — Patient Instructions (Addendum)
Preventive Care for Adults, Female A healthy lifestyle and preventive care can promote health and wellness. Preventive health guidelines for women include the following key practices.  A routine yearly physical is a good way to check with your caregiver about your health and preventive screening. It is a chance to share any concerns and updates on your health, and to receive a thorough exam.  Visit your dentist for a routine exam and preventive care every 6 months. Brush your teeth twice a day and floss once a day. Good oral hygiene prevents tooth decay and gum disease.  The frequency of eye exams is based on your age, health, family medical history, use of contact lenses, and other factors. Follow your caregiver's recommendations for frequency of eye exams.  Eat a healthy diet. Foods like vegetables, fruits, whole grains, low-fat dairy products, and lean protein foods contain the nutrients you need without too many calories. Decrease your intake of foods high in solid fats, added sugars, and salt. Eat the right amount of calories for you.Get information about a proper diet from your caregiver, if necessary.  Regular physical exercise is one of the most important things you can do for your health. Most adults should get at least 150 minutes of moderate-intensity exercise (any activity that increases your heart rate and causes you to sweat) each week. In addition, most adults need muscle-strengthening exercises on 2 or more days a week.  Maintain a healthy weight. The body mass index (BMI) is a screening tool to identify possible weight problems. It provides an estimate of body fat based on height and weight. Your caregiver can help determine your BMI, and can help you achieve or maintain a healthy weight.For adults 20 years and older:  A BMI below 18.5 is considered underweight.  A BMI of 18.5 to 24.9 is normal.  A BMI of 25 to 29.9 is considered overweight.  A BMI of 30 and above is  considered obese.  Maintain normal blood lipids and cholesterol levels by exercising and minimizing your intake of saturated fat. Eat a balanced diet with plenty of fruit and vegetables. Blood tests for lipids and cholesterol should begin at age 20 and be repeated every 5 years. If your lipid or cholesterol levels are high, you are over 50, or you are at high risk for heart disease, you may need your cholesterol levels checked more frequently.Ongoing high lipid and cholesterol levels should be treated with medicines if diet and exercise are not effective.  If you smoke, find out from your caregiver how to quit. If you do not use tobacco, do not start.  If you are pregnant, do not drink alcohol. If you are breastfeeding, be very cautious about drinking alcohol. If you are not pregnant and choose to drink alcohol, do not exceed 1 drink per day. One drink is considered to be 12 ounces (355 mL) of beer, 5 ounces (148 mL) of wine, or 1.5 ounces (44 mL) of liquor.  Avoid use of street drugs. Do not share needles with anyone. Ask for help if you need support or instructions about stopping the use of drugs.  High blood pressure causes heart disease and increases the risk of stroke. Your blood pressure should be checked at least every 1 to 2 years. Ongoing high blood pressure should be treated with medicines if weight loss and exercise are not effective.  If you are 55 to 57 years old, ask your caregiver if you should take aspirin to prevent strokes.  Diabetes   screening involves taking a blood sample to check your fasting blood sugar level. This should be done once every 3 years, after age 45, if you are within normal weight and without risk factors for diabetes. Testing should be considered at a younger age or be carried out more frequently if you are overweight and have at least 1 risk factor for diabetes.  Breast cancer screening is essential preventive care for women. You should practice "breast  self-awareness." This means understanding the normal appearance and feel of your breasts and may include breast self-examination. Any changes detected, no matter how small, should be reported to a caregiver. Women in their 20s and 30s should have a clinical breast exam (CBE) by a caregiver as part of a regular health exam every 1 to 3 years. After age 40, women should have a CBE every year. Starting at age 40, women should consider having a mammography (breast X-ray test) every year. Women who have a family history of breast cancer should talk to their caregiver about genetic screening. Women at a high risk of breast cancer should talk to their caregivers about having magnetic resonance imaging (MRI) and a mammography every year.  The Pap test is a screening test for cervical cancer. A Pap test can show cell changes on the cervix that might become cervical cancer if left untreated. A Pap test is a procedure in which cells are obtained and examined from the lower end of the uterus (cervix).  Women should have a Pap test starting at age 21.  Between ages 21 and 29, Pap tests should be repeated every 2 years.  Beginning at age 30, you should have a Pap test every 3 years as long as the past 3 Pap tests have been normal.  Some women have medical problems that increase the chance of getting cervical cancer. Talk to your caregiver about these problems. It is especially important to talk to your caregiver if a new problem develops soon after your last Pap test. In these cases, your caregiver may recommend more frequent screening and Pap tests.  The above recommendations are the same for women who have or have not gotten the vaccine for human papillomavirus (HPV).  If you had a hysterectomy for a problem that was not cancer or a condition that could lead to cancer, then you no longer need Pap tests. Even if you no longer need a Pap test, a regular exam is a good idea to make sure no other problems are  starting.  If you are between ages 65 and 70, and you have had normal Pap tests going back 10 years, you no longer need Pap tests. Even if you no longer need a Pap test, a regular exam is a good idea to make sure no other problems are starting.  If you have had past treatment for cervical cancer or a condition that could lead to cancer, you need Pap tests and screening for cancer for at least 20 years after your treatment.  If Pap tests have been discontinued, risk factors (such as a new sexual partner) need to be reassessed to determine if screening should be resumed.  The HPV test is an additional test that may be used for cervical cancer screening. The HPV test looks for the virus that can cause the cell changes on the cervix. The cells collected during the Pap test can be tested for HPV. The HPV test could be used to screen women aged 30 years and older, and should   be used in women of any age who have unclear Pap test results. After the age of 30, women should have HPV testing at the same frequency as a Pap test.  Colorectal cancer can be detected and often prevented. Most routine colorectal cancer screening begins at the age of 50 and continues through age 75. However, your caregiver may recommend screening at an earlier age if you have risk factors for colon cancer. On a yearly basis, your caregiver may provide home test kits to check for hidden blood in the stool. Use of a small camera at the end of a tube, to directly examine the colon (sigmoidoscopy or colonoscopy), can detect the earliest forms of colorectal cancer. Talk to your caregiver about this at age 50, when routine screening begins. Direct examination of the colon should be repeated every 5 to 10 years through age 75, unless early forms of pre-cancerous polyps or small growths are found.  Hepatitis C blood testing is recommended for all people born from 1945 through 1965 and any individual with known risks for hepatitis C.  Practice  safe sex. Use condoms and avoid high-risk sexual practices to reduce the spread of sexually transmitted infections (STIs). STIs include gonorrhea, chlamydia, syphilis, trichomonas, herpes, HPV, and human immunodeficiency virus (HIV). Herpes, HIV, and HPV are viral illnesses that have no cure. They can result in disability, cancer, and death. Sexually active women aged 25 and younger should be checked for chlamydia. Older women with new or multiple partners should also be tested for chlamydia. Testing for other STIs is recommended if you are sexually active and at increased risk.  Osteoporosis is a disease in which the bones lose minerals and strength with aging. This can result in serious bone fractures. The risk of osteoporosis can be identified using a bone density scan. Women ages 65 and over and women at risk for fractures or osteoporosis should discuss screening with their caregivers. Ask your caregiver whether you should take a calcium supplement or vitamin D to reduce the rate of osteoporosis.  Menopause can be associated with physical symptoms and risks. Hormone replacement therapy is available to decrease symptoms and risks. You should talk to your caregiver about whether hormone replacement therapy is right for you.  Use sunscreen with sun protection factor (SPF) of 30 or more. Apply sunscreen liberally and repeatedly throughout the day. You should seek shade when your shadow is shorter than you. Protect yourself by wearing long sleeves, pants, a wide-brimmed hat, and sunglasses year round, whenever you are outdoors.  Once a month, do a whole body skin exam, using a mirror to look at the skin on your back. Notify your caregiver of new moles, moles that have irregular borders, moles that are larger than a pencil eraser, or moles that have changed in shape or color.  Stay current with required immunizations.  Influenza. You need a dose every fall (or winter). The composition of the flu vaccine  changes each year, so being vaccinated once is not enough.  Pneumococcal polysaccharide. You need 1 to 2 doses if you smoke cigarettes or if you have certain chronic medical conditions. You need 1 dose at age 65 (or older) if you have never been vaccinated.  Tetanus, diphtheria, pertussis (Tdap, Td). Get 1 dose of Tdap vaccine if you are younger than age 65, are over 65 and have contact with an infant, are a healthcare worker, are pregnant, or simply want to be protected from whooping cough. After that, you need a Td   booster dose every 10 years. Consult your caregiver if you have not had at least 3 tetanus and diphtheria-containing shots sometime in your life or have a deep or dirty wound.  HPV. You need this vaccine if you are a woman age 26 or younger. The vaccine is given in 3 doses over 6 months.  Measles, mumps, rubella (MMR). You need at least 1 dose of MMR if you were born in 1957 or later. You may also need a second dose.  Meningococcal. If you are age 19 to 21 and a first-year college student living in a residence hall, or have one of several medical conditions, you need to get vaccinated against meningococcal disease. You may also need additional booster doses.  Zoster (shingles). If you are age 60 or older, you should get this vaccine.  Varicella (chickenpox). If you have never had chickenpox or you were vaccinated but received only 1 dose, talk to your caregiver to find out if you need this vaccine.  Hepatitis A. You need this vaccine if you have a specific risk factor for hepatitis A virus infection or you simply wish to be protected from this disease. The vaccine is usually given as 2 doses, 6 to 18 months apart.  Hepatitis B. You need this vaccine if you have a specific risk factor for hepatitis B virus infection or you simply wish to be protected from this disease. The vaccine is given in 3 doses, usually over 6 months. Preventive Services / Frequency Ages 19 to 39  Blood  pressure check.** / Every 1 to 2 years.  Lipid and cholesterol check.** / Every 5 years beginning at age 20.  Clinical breast exam.** / Every 3 years for women in their 20s and 30s.  Pap test.** / Every 2 years from ages 21 through 29. Every 3 years starting at age 30 through age 65 or 70 with a history of 3 consecutive normal Pap tests.  HPV screening.** / Every 3 years from ages 30 through ages 65 to 70 with a history of 3 consecutive normal Pap tests.  Hepatitis C blood test.** / For any individual with known risks for hepatitis C.  Skin self-exam. / Monthly.  Influenza immunization.** / Every year.  Pneumococcal polysaccharide immunization.** / 1 to 2 doses if you smoke cigarettes or if you have certain chronic medical conditions.  Tetanus, diphtheria, pertussis (Tdap, Td) immunization. / A one-time dose of Tdap vaccine. After that, you need a Td booster dose every 10 years.  HPV immunization. / 3 doses over 6 months, if you are 26 and younger.  Measles, mumps, rubella (MMR) immunization. / You need at least 1 dose of MMR if you were born in 1957 or later. You may also need a second dose.  Meningococcal immunization. / 1 dose if you are age 19 to 21 and a first-year college student living in a residence hall, or have one of several medical conditions, you need to get vaccinated against meningococcal disease. You may also need additional booster doses.  Varicella immunization.** / Consult your caregiver.  Hepatitis A immunization.** / Consult your caregiver. 2 doses, 6 to 18 months apart.  Hepatitis B immunization.** / Consult your caregiver. 3 doses usually over 6 months. Ages 40 to 64  Blood pressure check.** / Every 1 to 2 years.  Lipid and cholesterol check.** / Every 5 years beginning at age 20.  Clinical breast exam.** / Every year after age 40.  Mammogram.** / Every year beginning at age 40   and continuing for as long as you are in good health. Consult with your  caregiver.  Pap test.** / Every 3 years starting at age 30 through age 65 or 70 with a history of 3 consecutive normal Pap tests.  HPV screening.** / Every 3 years from ages 30 through ages 65 to 70 with a history of 3 consecutive normal Pap tests.  Fecal occult blood test (FOBT) of stool. / Every year beginning at age 50 and continuing until age 75. You may not need to do this test if you get a colonoscopy every 10 years.  Flexible sigmoidoscopy or colonoscopy.** / Every 5 years for a flexible sigmoidoscopy or every 10 years for a colonoscopy beginning at age 50 and continuing until age 75.  Hepatitis C blood test.** / For all people born from 1945 through 1965 and any individual with known risks for hepatitis C.  Skin self-exam. / Monthly.  Influenza immunization.** / Every year.  Pneumococcal polysaccharide immunization.** / 1 to 2 doses if you smoke cigarettes or if you have certain chronic medical conditions.  Tetanus, diphtheria, pertussis (Tdap, Td) immunization.** / A one-time dose of Tdap vaccine. After that, you need a Td booster dose every 10 years.  Measles, mumps, rubella (MMR) immunization. / You need at least 1 dose of MMR if you were born in 1957 or later. You may also need a second dose.  Varicella immunization.** / Consult your caregiver.  Meningococcal immunization.** / Consult your caregiver.  Hepatitis A immunization.** / Consult your caregiver. 2 doses, 6 to 18 months apart.  Hepatitis B immunization.** / Consult your caregiver. 3 doses, usually over 6 months. Ages 65 and over  Blood pressure check.** / Every 1 to 2 years.  Lipid and cholesterol check.** / Every 5 years beginning at age 20.  Clinical breast exam.** / Every year after age 40.  Mammogram.** / Every year beginning at age 40 and continuing for as long as you are in good health. Consult with your caregiver.  Pap test.** / Every 3 years starting at age 30 through age 65 or 70 with a 3  consecutive normal Pap tests. Testing can be stopped between 65 and 70 with 3 consecutive normal Pap tests and no abnormal Pap or HPV tests in the past 10 years.  HPV screening.** / Every 3 years from ages 30 through ages 65 or 70 with a history of 3 consecutive normal Pap tests. Testing can be stopped between 65 and 70 with 3 consecutive normal Pap tests and no abnormal Pap or HPV tests in the past 10 years.  Fecal occult blood test (FOBT) of stool. / Every year beginning at age 50 and continuing until age 75. You may not need to do this test if you get a colonoscopy every 10 years.  Flexible sigmoidoscopy or colonoscopy.** / Every 5 years for a flexible sigmoidoscopy or every 10 years for a colonoscopy beginning at age 50 and continuing until age 75.  Hepatitis C blood test.** / For all people born from 1945 through 1965 and any individual with known risks for hepatitis C.  Osteoporosis screening.** / A one-time screening for women ages 65 and over and women at risk for fractures or osteoporosis.  Skin self-exam. / Monthly.  Influenza immunization.** / Every year.  Pneumococcal polysaccharide immunization.** / 1 dose at age 65 (or older) if you have never been vaccinated.  Tetanus, diphtheria, pertussis (Tdap, Td) immunization. / A one-time dose of Tdap vaccine if you are over   65 and have contact with an infant, are a healthcare worker, or simply want to be protected from whooping cough. After that, you need a Td booster dose every 10 years.  Varicella immunization.** / Consult your caregiver.  Meningococcal immunization.** / Consult your caregiver.  Hepatitis A immunization.** / Consult your caregiver. 2 doses, 6 to 18 months apart.  Hepatitis B immunization.** / Check with your caregiver. 3 doses, usually over 6 months. ** Family history and personal history of risk and conditions may change your caregiver's recommendations. Document Released: 11/28/2001 Document Revised: 12/25/2011  Document Reviewed: 02/27/2011 [employer] Patient Information 2013 [employer].  

## 2013-01-09 NOTE — Progress Notes (Signed)
Subjective:     Kaitlyn Perez is a 57 y.o. female and is here for a comprehensive physical exam. The patient reports no problems.  History   Social History  . Marital Status: Married    Spouse Name: Kaitlyn Perez    Number of Children: N/A  . Years of Education: 73   Occupational History  . exec VP [employer]   Social History Main Topics  . Smoking status: Never Smoker   . Smokeless tobacco: Never Used  . Alcohol Use: 0.0 oz/week     Comment: rare--1 x a month  . Drug Use: No  . Sexually Active: Yes -- Female partner(s)   Other Topics Concern  . Not on file   Social History Narrative   Exercise--- no   Health Maintenance  Topic Date Due  . Tetanus/tdap  06/11/1975  . Influenza Vaccine  06/16/2013  . Mammogram  10/25/2013  . Pap Smear  11/13/2014  . Colonoscopy  05/29/2017    The following portions of the patient's history were reviewed and updated as appropriate:  She  has a past medical history of Cancer (2006). She  does not have any pertinent problems on file. She  has past surgical history that includes Nasal sinus surgery (2001); Tonsillectomy and adenoidectomy (1967); and Mandible surgery (Bilateral, 08/2012). Her family history includes Arthritis in her mother; Breast cancer in her maternal aunt and unspecified family member; Cancer in her brother; Cancer (age of onset: 42) in her mother; Colon cancer in her paternal uncle; Diabetes in her father; Heart disease (age of onset: 60) in her maternal grandfather; Heart disease (age of onset: 64) in her maternal grandmother; Hyperlipidemia in her father and mother; Hypertension in her father; Hypotension in her mother; Lung cancer in her paternal uncle; and Uterine cancer in her paternal aunt. She  reports that she has never smoked. She has never used smokeless tobacco. She reports that  drinks alcohol. She reports that she does not use illicit drugs. She has a current medication list which includes the following  prescription(s): b complex vitamins, conjugated estrogens, flaxseed (linseed), and probiotic product. Current Outpatient Prescriptions on File Prior to Visit  Medication Sig Dispense Refill  . conjugated estrogens (PREMARIN) vaginal cream Place 0.25 Applicatorfuls vaginally 2 (two) times a week.  42.5 g  0   No current facility-administered medications on file prior to visit.   She is allergic to neosporin..  Review of Systems Review of Systems  Constitutional: Negative for activity change, appetite change and fatigue.  HENT: Negative for hearing loss, congestion, tinnitus and ear discharge.  dentist q64m Eyes: Negative for visual disturbance (see optho q1y -- vision corrected to 20/20 with glasses).  Respiratory: Negative for cough, chest tightness and shortness of breath.   Cardiovascular: Negative for chest pain, palpitations and leg swelling.  Gastrointestinal: Negative for abdominal pain, diarrhea, constipation and abdominal distention.  Genitourinary: Negative for urgency, frequency, decreased urine volume and difficulty urinating.  Musculoskeletal: Negative for back pain, arthralgias and gait problem.  Skin: Negative for color change, pallor and rash.  Neurological: Negative for dizziness, light-headedness, numbness and headaches.  Hematological: Negative for adenopathy. Does not bruise/bleed easily.  Psychiatric/Behavioral: Negative for suicidal ideas, confusion, sleep disturbance, self-injury, dysphoric mood, decreased concentration and agitation.       Objective:    BP 114/60  Pulse 62  Temp(Src) 97.9 F (36.6 C) (Oral)  Ht 5\' 5"  (1.651 m)  Wt 132 lb (59.875 kg)  BMI 21.97 kg/m2  SpO2 98% General appearance: alert, cooperative, appears stated age and no distress Head: Normocephalic, without obvious abnormality, atraumatic Eyes: conjunctivae/corneas clear. PERRL, EOM's intact. Fundi benign. Ears: normal TM's and external ear canals both ears Nose: Nares normal.  Septum midline. Mucosa normal. No drainage or sinus tenderness. Throat: lips, mucosa, and tongue normal; teeth and gums normal Neck: no adenopathy, no carotid bruit, no JVD, supple, symmetrical, trachea midline and thyroid not enlarged, symmetric, no tenderness/mass/nodules Back: symmetric, no curvature. ROM normal. No CVA tenderness. Lungs: clear to auscultation bilaterally Breasts: normal appearance, no masses or tenderness Heart: regular rate and rhythm, S1, S2 normal, no murmur, click, rub or gallop Abdomen: soft, non-tender; bowel sounds normal; no masses,  no organomegaly Pelvic: deferred Extremities: extremities normal, atraumatic, no cyanosis or edema Pulses: 2+ and symmetric Skin: Skin color, texture, turgor normal. No rashes or lesions Lymph nodes: Cervical, supraclavicular, and axillary nodes normal. Neurologic: Alert and oriented X 3, normal strength and tone. Normal symmetric reflexes. Normal coordination and gait Psych-- no depression, no anxiety      Assessment:    Healthy female exam.      Plan:    ghm utd Check labs See After Visit Summary for Counseling Recommendations

## 2013-05-30 ENCOUNTER — Other Ambulatory Visit: Payer: Self-pay | Admitting: Family Medicine

## 2013-10-23 ENCOUNTER — Other Ambulatory Visit: Payer: Self-pay | Admitting: Dermatology

## 2013-10-31 LAB — HM MAMMOGRAPHY: HM MAMMO: NORMAL

## 2013-12-23 ENCOUNTER — Encounter: Payer: Self-pay | Admitting: Family Medicine

## 2014-04-13 ENCOUNTER — Encounter: Payer: BC Managed Care – PPO | Admitting: Family Medicine

## 2014-06-12 ENCOUNTER — Ambulatory Visit (INDEPENDENT_AMBULATORY_CARE_PROVIDER_SITE_OTHER): Payer: BC Managed Care – PPO | Admitting: Family Medicine

## 2014-06-12 ENCOUNTER — Encounter: Payer: Self-pay | Admitting: Family Medicine

## 2014-06-12 VITALS — BP 134/90 | HR 59 | Temp 97.7°F | Ht 63.75 in | Wt 138.4 lb

## 2014-06-12 DIAGNOSIS — R200 Anesthesia of skin: Secondary | ICD-10-CM

## 2014-06-12 DIAGNOSIS — Z780 Asymptomatic menopausal state: Secondary | ICD-10-CM

## 2014-06-12 DIAGNOSIS — Z0000 Encounter for general adult medical examination without abnormal findings: Secondary | ICD-10-CM

## 2014-06-12 DIAGNOSIS — R209 Unspecified disturbances of skin sensation: Secondary | ICD-10-CM

## 2014-06-12 DIAGNOSIS — N951 Menopausal and female climacteric states: Secondary | ICD-10-CM

## 2014-06-12 LAB — CBC WITH DIFFERENTIAL/PLATELET
BASOS ABS: 0 10*3/uL (ref 0.0–0.1)
BASOS PCT: 0.9 % (ref 0.0–3.0)
Eosinophils Absolute: 0 10*3/uL (ref 0.0–0.7)
Eosinophils Relative: 1 % (ref 0.0–5.0)
HCT: 40.7 % (ref 36.0–46.0)
HEMOGLOBIN: 13.7 g/dL (ref 12.0–15.0)
LYMPHS PCT: 31.8 % (ref 12.0–46.0)
Lymphs Abs: 1.3 10*3/uL (ref 0.7–4.0)
MCHC: 33.7 g/dL (ref 30.0–36.0)
MCV: 94.6 fl (ref 78.0–100.0)
MONOS PCT: 10.8 % (ref 3.0–12.0)
Monocytes Absolute: 0.4 10*3/uL (ref 0.1–1.0)
NEUTROS ABS: 2.3 10*3/uL (ref 1.4–7.7)
Neutrophils Relative %: 55.5 % (ref 43.0–77.0)
Platelets: 239 10*3/uL (ref 150.0–400.0)
RBC: 4.3 Mil/uL (ref 3.87–5.11)
RDW: 13.8 % (ref 11.5–15.5)
WBC: 4.1 10*3/uL (ref 4.0–10.5)

## 2014-06-12 LAB — HEPATIC FUNCTION PANEL
ALBUMIN: 4.1 g/dL (ref 3.5–5.2)
ALK PHOS: 39 U/L (ref 39–117)
ALT: 14 U/L (ref 0–35)
AST: 19 U/L (ref 0–37)
Bilirubin, Direct: 0 mg/dL (ref 0.0–0.3)
TOTAL PROTEIN: 7.1 g/dL (ref 6.0–8.3)
Total Bilirubin: 0.7 mg/dL (ref 0.2–1.2)

## 2014-06-12 LAB — BASIC METABOLIC PANEL
BUN: 16 mg/dL (ref 6–23)
CALCIUM: 9.3 mg/dL (ref 8.4–10.5)
CO2: 28 mEq/L (ref 19–32)
Chloride: 106 mEq/L (ref 96–112)
Creatinine, Ser: 0.7 mg/dL (ref 0.4–1.2)
GFR: 89.86 mL/min (ref >60.00)
Glucose, Bld: 80 mg/dL (ref 70–99)
Potassium: 3.5 mEq/L (ref 3.5–5.1)
SODIUM: 141 meq/L (ref 135–145)

## 2014-06-12 LAB — LIPID PANEL
Cholesterol: 265 mg/dL — ABNORMAL HIGH (ref 0–200)
HDL: 88.7 mg/dL (ref >39.00)
LDL Cholesterol: 163 mg/dL — ABNORMAL HIGH (ref 0–99)
NONHDL: 176.3
Total CHOL/HDL Ratio: 3
Triglycerides: 69 mg/dL (ref 0.0–149.0)
VLDL: 13.8 mg/dL (ref 0.0–40.0)

## 2014-06-12 LAB — TSH: TSH: 0.93 u[IU]/mL (ref 0.35–4.50)

## 2014-06-12 MED ORDER — ESTROGENS, CONJUGATED 0.625 MG/GM VA CREA: TOPICAL_CREAM | VAGINAL | Status: DC

## 2014-06-12 NOTE — Progress Notes (Deleted)
Subjective:     Patient ID: Kaitlyn Perez, female   DOB: 04/20/1956, 58 y.o.   MRN: [id number]  HPI   Review of Systems     Objective:   Physical Exam     Assessment:     ***    Plan:     ***

## 2014-06-12 NOTE — Progress Notes (Signed)
Patient ID: Kaitlyn Perez, female   DOB: May 22, 1956, 58 y.o.   MRN: [id number]

## 2014-06-12 NOTE — Patient Instructions (Signed)
Preventive Care for Adults A healthy lifestyle and preventive care can promote health and wellness. Preventive health guidelines for women include the following key practices.  A routine yearly physical is a good way to check with your health care provider about your health and preventive screening. It is a chance to share any concerns and updates on your health and to receive a thorough exam.  Visit your dentist for a routine exam and preventive care every 6 months. Brush your teeth twice a day and floss once a day. Good oral hygiene prevents tooth decay and gum disease.  The frequency of eye exams is based on your age, health, family medical history, use of contact lenses, and other factors. Follow your health care provider's recommendations for frequency of eye exams.  Eat a healthy diet. Foods like vegetables, fruits, whole grains, low-fat dairy products, and lean protein foods contain the nutrients you need without too many calories. Decrease your intake of foods high in solid fats, added sugars, and salt. Eat the right amount of calories for you.Get information about a proper diet from your health care provider, if necessary.  Regular physical exercise is one of the most important things you can do for your health. Most adults should get at least 150 minutes of moderate-intensity exercise (any activity that increases your heart rate and causes you to sweat) each week. In addition, most adults need muscle-strengthening exercises on 2 or more days a week.  Maintain a healthy weight. The body mass index (BMI) is a screening tool to identify possible weight problems. It provides an estimate of body fat based on height and weight. Your health care provider can find your BMI and can help you achieve or maintain a healthy weight.For adults 20 years and older:  A BMI below 18.5 is considered underweight.  A BMI of 18.5 to 24.9 is normal.  A BMI of 25 to 29.9 is considered overweight.  A BMI of  30 and above is considered obese.  Maintain normal blood lipids and cholesterol levels by exercising and minimizing your intake of saturated fat. Eat a balanced diet with plenty of fruit and vegetables. Blood tests for lipids and cholesterol should begin at age 76 and be repeated every 5 years. If your lipid or cholesterol levels are high, you are over 50, or you are at high risk for heart disease, you may need your cholesterol levels checked more frequently.Ongoing high lipid and cholesterol levels should be treated with medicines if diet and exercise are not working.  If you smoke, find out from your health care provider how to quit. If you do not use tobacco, do not start.  Lung cancer screening is recommended for adults aged 22-80 years who are at high risk for developing lung cancer because of a history of smoking. A yearly low-dose CT scan of the lungs is recommended for people who have at least a 30-pack-year history of smoking and are a current smoker or have quit within the past 15 years. A pack year of smoking is smoking an average of 1 pack of cigarettes a day for 1 year (for example: 1 pack a day for 30 years or 2 packs a day for 15 years). Yearly screening should continue until the smoker has stopped smoking for at least 15 years. Yearly screening should be stopped for people who develop a health problem that would prevent them from having lung cancer treatment.  If you are pregnant, do not drink alcohol. If you are breastfeeding,  be very cautious about drinking alcohol. If you are not pregnant and choose to drink alcohol, do not have more than 1 drink per day. One drink is considered to be 12 ounces (355 mL) of beer, 5 ounces (148 mL) of wine, or 1.5 ounces (44 mL) of liquor.  Avoid use of street drugs. Do not share needles with anyone. Ask for help if you need support or instructions about stopping the use of drugs.  High blood pressure causes heart disease and increases the risk of  stroke. Your blood pressure should be checked at least every 1 to 2 years. Ongoing high blood pressure should be treated with medicines if weight loss and exercise do not work.  If you are 75-52 years old, ask your health care provider if you should take aspirin to prevent strokes.  Diabetes screening involves taking a blood sample to check your fasting blood sugar level. This should be done once every 3 years, after age 15, if you are within normal weight and without risk factors for diabetes. Testing should be considered at a younger age or be carried out more frequently if you are overweight and have at least 1 risk factor for diabetes.  Breast cancer screening is essential preventive care for women. You should practice "breast self-awareness." This means understanding the normal appearance and feel of your breasts and may include breast self-examination. Any changes detected, no matter how small, should be reported to a health care provider. Women in their 58s and 30s should have a clinical breast exam (CBE) by a health care provider as part of a regular health exam every 1 to 3 years. After age 16, women should have a CBE every year. Starting at age 53, women should consider having a mammogram (breast X-ray test) every year. Women who have a family history of breast cancer should talk to their health care provider about genetic screening. Women at a high risk of breast cancer should talk to their health care providers about having an MRI and a mammogram every year.  Breast cancer gene (BRCA)-related cancer risk assessment is recommended for women who have family members with BRCA-related cancers. BRCA-related cancers include breast, ovarian, tubal, and peritoneal cancers. Having family members with these cancers may be associated with an increased risk for harmful changes (mutations) in the breast cancer genes BRCA1 and BRCA2. Results of the assessment will determine the need for genetic counseling and  BRCA1 and BRCA2 testing.  Routine pelvic exams to screen for cancer are no longer recommended for nonpregnant women who are considered low risk for cancer of the pelvic organs (ovaries, uterus, and vagina) and who do not have symptoms. Ask your health care provider if a screening pelvic exam is right for you.  If you have had past treatment for cervical cancer or a condition that could lead to cancer, you need Pap tests and screening for cancer for at least 20 years after your treatment. If Pap tests have been discontinued, your risk factors (such as having a new sexual partner) need to be reassessed to determine if screening should be resumed. Some women have medical problems that increase the chance of getting cervical cancer. In these cases, your health care provider may recommend more frequent screening and Pap tests.  The HPV test is an additional test that may be used for cervical cancer screening. The HPV test looks for the virus that can cause the cell changes on the cervix. The cells collected during the Pap test can be  tested for HPV. The HPV test could be used to screen women aged 30 years and older, and should be used in women of any age who have unclear Pap test results. After the age of 30, women should have HPV testing at the same frequency as a Pap test.  Colorectal cancer can be detected and often prevented. Most routine colorectal cancer screening begins at the age of 50 years and continues through age 75 years. However, your health care provider may recommend screening at an earlier age if you have risk factors for colon cancer. On a yearly basis, your health care provider may provide home test kits to check for hidden blood in the stool. Use of a small camera at the end of a tube, to directly examine the colon (sigmoidoscopy or colonoscopy), can detect the earliest forms of colorectal cancer. Talk to your health care provider about this at age 50, when routine screening begins. Direct  exam of the colon should be repeated every 5-10 years through age 75 years, unless early forms of pre-cancerous polyps or small growths are found.  People who are at an increased risk for hepatitis B should be screened for this virus. You are considered at high risk for hepatitis B if:  You were born in a country where hepatitis B occurs often. Talk with your health care provider about which countries are considered high risk.  Your parents were born in a high-risk country and you have not received a shot to protect against hepatitis B (hepatitis B vaccine).  You have HIV or AIDS.  You use needles to inject street drugs.  You live with, or have sex with, someone who has hepatitis B.  You get hemodialysis treatment.  You take certain medicines for conditions like cancer, organ transplantation, and autoimmune conditions.  Hepatitis C blood testing is recommended for all people born from 1945 through 1965 and any individual with known risks for hepatitis C.  Practice safe sex. Use condoms and avoid high-risk sexual practices to reduce the spread of sexually transmitted infections (STIs). STIs include gonorrhea, chlamydia, syphilis, trichomonas, herpes, HPV, and human immunodeficiency virus (HIV). Herpes, HIV, and HPV are viral illnesses that have no cure. They can result in disability, cancer, and death.  You should be screened for sexually transmitted illnesses (STIs) including gonorrhea and chlamydia if:  You are sexually active and are younger than 24 years.  You are older than 24 years and your health care provider tells you that you are at risk for this type of infection.  Your sexual activity has changed since you were last screened and you are at an increased risk for chlamydia or gonorrhea. Ask your health care provider if you are at risk.  If you are at risk of being infected with HIV, it is recommended that you take a prescription medicine daily to prevent HIV infection. This is  called preexposure prophylaxis (PrEP). You are considered at risk if:  You are a heterosexual woman, are sexually active, and are at increased risk for HIV infection.  You take drugs by injection.  You are sexually active with a partner who has HIV.  Talk with your health care provider about whether you are at high risk of being infected with HIV. If you choose to begin PrEP, you should first be tested for HIV. You should then be tested every 3 months for as long as you are taking PrEP.  Osteoporosis is a disease in which the bones lose minerals and strength   with aging. This can result in serious bone fractures or breaks. The risk of osteoporosis can be identified using a bone density scan. Women ages 65 years and over and women at risk for fractures or osteoporosis should discuss screening with their health care providers. Ask your health care provider whether you should take a calcium supplement or vitamin D to reduce the rate of osteoporosis.  Menopause can be associated with physical symptoms and risks. Hormone replacement therapy is available to decrease symptoms and risks. You should talk to your health care provider about whether hormone replacement therapy is right for you.  Use sunscreen. Apply sunscreen liberally and repeatedly throughout the day. You should seek shade when your shadow is shorter than you. Protect yourself by wearing long sleeves, pants, a wide-brimmed hat, and sunglasses year round, whenever you are outdoors.  Once a month, do a whole body skin exam, using a mirror to look at the skin on your back. Tell your health care provider of new moles, moles that have irregular borders, moles that are larger than a pencil eraser, or moles that have changed in shape or color.  Stay current with required vaccines (immunizations).  Influenza vaccine. All adults should be immunized every year.  Tetanus, diphtheria, and acellular pertussis (Td, Tdap) vaccine. Pregnant women should  receive 1 dose of Tdap vaccine during each pregnancy. The dose should be obtained regardless of the length of time since the last dose. Immunization is preferred during the 27th-36th week of gestation. An adult who has not previously received Tdap or who does not know her vaccine status should receive 1 dose of Tdap. This initial dose should be followed by tetanus and diphtheria toxoids (Td) booster doses every 10 years. Adults with an unknown or incomplete history of completing a 3-dose immunization series with Td-containing vaccines should begin or complete a primary immunization series including a Tdap dose. Adults should receive a Td booster every 10 years.  Varicella vaccine. An adult without evidence of immunity to varicella should receive 2 doses or a second dose if she has previously received 1 dose. Pregnant females who do not have evidence of immunity should receive the first dose after pregnancy. This first dose should be obtained before leaving the health care facility. The second dose should be obtained 4-8 weeks after the first dose.  Human papillomavirus (HPV) vaccine. Females aged 13-26 years who have not received the vaccine previously should obtain the 3-dose series. The vaccine is not recommended for use in pregnant females. However, pregnancy testing is not needed before receiving a dose. If a female is found to be pregnant after receiving a dose, no treatment is needed. In that case, the remaining doses should be delayed until after the pregnancy. Immunization is recommended for any person with an immunocompromised condition through the age of 26 years if she did not get any or all doses earlier. During the 3-dose series, the second dose should be obtained 4-8 weeks after the first dose. The third dose should be obtained 24 weeks after the first dose and 16 weeks after the second dose.  Zoster vaccine. One dose is recommended for adults aged 60 years or older unless certain conditions are  present.  Measles, mumps, and rubella (MMR) vaccine. Adults born before 1957 generally are considered immune to measles and mumps. Adults born in 1957 or later should have 1 or more doses of MMR vaccine unless there is a contraindication to the vaccine or there is laboratory evidence of immunity to   each of the three diseases. A routine second dose of MMR vaccine should be obtained at least 28 days after the first dose for students attending postsecondary schools, health care workers, or international travelers. People who received inactivated measles vaccine or an unknown type of measles vaccine during 1963-1967 should receive 2 doses of MMR vaccine. People who received inactivated mumps vaccine or an unknown type of mumps vaccine before 1979 and are at high risk for mumps infection should consider immunization with 2 doses of MMR vaccine. For females of childbearing age, rubella immunity should be determined. If there is no evidence of immunity, females who are not pregnant should be vaccinated. If there is no evidence of immunity, females who are pregnant should delay immunization until after pregnancy. Unvaccinated health care workers born before 1957 who lack laboratory evidence of measles, mumps, or rubella immunity or laboratory confirmation of disease should consider measles and mumps immunization with 2 doses of MMR vaccine or rubella immunization with 1 dose of MMR vaccine.  Pneumococcal 13-valent conjugate (PCV13) vaccine. When indicated, a person who is uncertain of her immunization history and has no record of immunization should receive the PCV13 vaccine. An adult aged 19 years or older who has certain medical conditions and has not been previously immunized should receive 1 dose of PCV13 vaccine. This PCV13 should be followed with a dose of pneumococcal polysaccharide (PPSV23) vaccine. The PPSV23 vaccine dose should be obtained at least 8 weeks after the dose of PCV13 vaccine. An adult aged 19  years or older who has certain medical conditions and previously received 1 or more doses of PPSV23 vaccine should receive 1 dose of PCV13. The PCV13 vaccine dose should be obtained 1 or more years after the last PPSV23 vaccine dose.  Pneumococcal polysaccharide (PPSV23) vaccine. When PCV13 is also indicated, PCV13 should be obtained first. All adults aged 65 years and older should be immunized. An adult younger than age 65 years who has certain medical conditions should be immunized. Any person who resides in a nursing home or long-term care facility should be immunized. An adult smoker should be immunized. People with an immunocompromised condition and certain other conditions should receive both PCV13 and PPSV23 vaccines. People with human immunodeficiency virus (HIV) infection should be immunized as soon as possible after diagnosis. Immunization during chemotherapy or radiation therapy should be avoided. Routine use of PPSV23 vaccine is not recommended for American Indians, [place] Natives, or people younger than 65 years unless there are medical conditions that require PPSV23 vaccine. When indicated, people who have unknown immunization and have no record of immunization should receive PPSV23 vaccine. One-time revaccination 5 years after the first dose of PPSV23 is recommended for people aged 19-64 years who have chronic kidney failure, nephrotic syndrome, asplenia, or immunocompromised conditions. People who received 1-2 doses of PPSV23 before age 65 years should receive another dose of PPSV23 vaccine at age 65 years or later if at least 5 years have passed since the previous dose. Doses of PPSV23 are not needed for people immunized with PPSV23 at or after age 65 years.  Meningococcal vaccine. Adults with asplenia or persistent complement component deficiencies should receive 2 doses of quadrivalent meningococcal conjugate (MenACWY-D) vaccine. The doses should be obtained at least 2 months apart.  Microbiologists working with certain meningococcal bacteria, military recruits, people at risk during an outbreak, and people who travel to or live in countries with a high rate of meningitis should be immunized. A first-year college student up through age   21 years who is living in a residence hall should receive a dose if she did not receive a dose on or after her 16th birthday. Adults who have certain high-risk conditions should receive one or more doses of vaccine.  Hepatitis A vaccine. Adults who wish to be protected from this disease, have certain high-risk conditions, work with hepatitis A-infected animals, work in hepatitis A research labs, or travel to or work in countries with a high rate of hepatitis A should be immunized. Adults who were previously unvaccinated and who anticipate close contact with an international adoptee during the first 60 days after arrival in the [place] from a country with a high rate of hepatitis A should be immunized.  Hepatitis B vaccine. Adults who wish to be protected from this disease, have certain high-risk conditions, may be exposed to blood or other infectious body fluids, are household contacts or sex partners of hepatitis B positive people, are clients or workers in certain care facilities, or travel to or work in countries with a high rate of hepatitis B should be immunized.  Haemophilus influenzae type b (Hib) vaccine. A previously unvaccinated person with asplenia or sickle cell disease or having a scheduled splenectomy should receive 1 dose of Hib vaccine. Regardless of previous immunization, a recipient of a hematopoietic stem cell transplant should receive a 3-dose series 6-12 months after her successful transplant. Hib vaccine is not recommended for adults with HIV infection. Preventive Services / Frequency Ages 64 to 68 years  Blood pressure check.** / Every 1 to 2 years.  Lipid and cholesterol check.** / Every 5 years beginning at age  22.  Clinical breast exam.** / Every 3 years for women in their 88s and 53s.  BRCA-related cancer risk assessment.** / For women who have family members with a BRCA-related cancer (breast, ovarian, tubal, or peritoneal cancers).  Pap test.** / Every 2 years from ages 90 through 51. Every 3 years starting at age 21 through age 56 or 3 with a history of 3 consecutive normal Pap tests.  HPV screening.** / Every 3 years from ages 24 through ages 1 to 46 with a history of 3 consecutive normal Pap tests.  Hepatitis C blood test.** / For any individual with known risks for hepatitis C.  Skin self-exam. / Monthly.  Influenza vaccine. / Every year.  Tetanus, diphtheria, and acellular pertussis (Tdap, Td) vaccine.** / Consult your health care provider. Pregnant women should receive 1 dose of Tdap vaccine during each pregnancy. 1 dose of Td every 10 years.  Varicella vaccine.** / Consult your health care provider. Pregnant females who do not have evidence of immunity should receive the first dose after pregnancy.  HPV vaccine. / 3 doses over 6 months, if 72 and younger. The vaccine is not recommended for use in pregnant females. However, pregnancy testing is not needed before receiving a dose.  Measles, mumps, rubella (MMR) vaccine.** / You need at least 1 dose of MMR if you were born in 1957 or later. You may also need a 2nd dose. For females of childbearing age, rubella immunity should be determined. If there is no evidence of immunity, females who are not pregnant should be vaccinated. If there is no evidence of immunity, females who are pregnant should delay immunization until after pregnancy.  Pneumococcal 13-valent conjugate (PCV13) vaccine.** / Consult your health care provider.  Pneumococcal polysaccharide (PPSV23) vaccine.** / 1 to 2 doses if you smoke cigarettes or if you have certain conditions.  Meningococcal vaccine.** /  1 dose if you are age 19 to 21 years and a first-year college  student living in a residence hall, or have one of several medical conditions, you need to get vaccinated against meningococcal disease. You may also need additional booster doses.  Hepatitis A vaccine.** / Consult your health care provider.  Hepatitis B vaccine.** / Consult your health care provider.  Haemophilus influenzae type b (Hib) vaccine.** / Consult your health care provider. Ages 40 to 64 years  Blood pressure check.** / Every 1 to 2 years.  Lipid and cholesterol check.** / Every 5 years beginning at age 20 years.  Lung cancer screening. / Every year if you are aged 55-80 years and have a 30-pack-year history of smoking and currently smoke or have quit within the past 15 years. Yearly screening is stopped once you have quit smoking for at least 15 years or develop a health problem that would prevent you from having lung cancer treatment.  Clinical breast exam.** / Every year after age 40 years.  BRCA-related cancer risk assessment.** / For women who have family members with a BRCA-related cancer (breast, ovarian, tubal, or peritoneal cancers).  Mammogram.** / Every year beginning at age 40 years and continuing for as long as you are in good health. Consult with your health care provider.  Pap test.** / Every 3 years starting at age 30 years through age 65 or 70 years with a history of 3 consecutive normal Pap tests.  HPV screening.** / Every 3 years from ages 30 years through ages 65 to 70 years with a history of 3 consecutive normal Pap tests.  Fecal occult blood test (FOBT) of stool. / Every year beginning at age 50 years and continuing until age 75 years. You may not need to do this test if you get a colonoscopy every 10 years.  Flexible sigmoidoscopy or colonoscopy.** / Every 5 years for a flexible sigmoidoscopy or every 10 years for a colonoscopy beginning at age 50 years and continuing until age 75 years.  Hepatitis C blood test.** / For all people born from 1945 through  1965 and any individual with known risks for hepatitis C.  Skin self-exam. / Monthly.  Influenza vaccine. / Every year.  Tetanus, diphtheria, and acellular pertussis (Tdap/Td) vaccine.** / Consult your health care provider. Pregnant women should receive 1 dose of Tdap vaccine during each pregnancy. 1 dose of Td every 10 years.  Varicella vaccine.** / Consult your health care provider. Pregnant females who do not have evidence of immunity should receive the first dose after pregnancy.  Zoster vaccine.** / 1 dose for adults aged 60 years or older.  Measles, mumps, rubella (MMR) vaccine.** / You need at least 1 dose of MMR if you were born in 1957 or later. You may also need a 2nd dose. For females of childbearing age, rubella immunity should be determined. If there is no evidence of immunity, females who are not pregnant should be vaccinated. If there is no evidence of immunity, females who are pregnant should delay immunization until after pregnancy.  Pneumococcal 13-valent conjugate (PCV13) vaccine.** / Consult your health care provider.  Pneumococcal polysaccharide (PPSV23) vaccine.** / 1 to 2 doses if you smoke cigarettes or if you have certain conditions.  Meningococcal vaccine.** / Consult your health care provider.  Hepatitis A vaccine.** / Consult your health care provider.  Hepatitis B vaccine.** / Consult your health care provider.  Haemophilus influenzae type b (Hib) vaccine.** / Consult your health care provider. Ages 65   years and over  Blood pressure check.** / Every 1 to 2 years.  Lipid and cholesterol check.** / Every 5 years beginning at age 22 years.  Lung cancer screening. / Every year if you are aged 73-80 years and have a 30-pack-year history of smoking and currently smoke or have quit within the past 15 years. Yearly screening is stopped once you have quit smoking for at least 15 years or develop a health problem that would prevent you from having lung cancer  treatment.  Clinical breast exam.** / Every year after age 4 years.  BRCA-related cancer risk assessment.** / For women who have family members with a BRCA-related cancer (breast, ovarian, tubal, or peritoneal cancers).  Mammogram.** / Every year beginning at age 40 years and continuing for as long as you are in good health. Consult with your health care provider.  Pap test.** / Every 3 years starting at age 9 years through age 34 or 91 years with 3 consecutive normal Pap tests. Testing can be stopped between 65 and 70 years with 3 consecutive normal Pap tests and no abnormal Pap or HPV tests in the past 10 years.  HPV screening.** / Every 3 years from ages 57 years through ages 64 or 45 years with a history of 3 consecutive normal Pap tests. Testing can be stopped between 65 and 70 years with 3 consecutive normal Pap tests and no abnormal Pap or HPV tests in the past 10 years.  Fecal occult blood test (FOBT) of stool. / Every year beginning at age 15 years and continuing until age 17 years. You may not need to do this test if you get a colonoscopy every 10 years.  Flexible sigmoidoscopy or colonoscopy.** / Every 5 years for a flexible sigmoidoscopy or every 10 years for a colonoscopy beginning at age 86 years and continuing until age 71 years.  Hepatitis C blood test.** / For all people born from [birth year] through 1965 and any individual with known risks for hepatitis C.  Osteoporosis screening.** / A one-time screening for women ages 83 years and over and women at risk for fractures or osteoporosis.  Skin self-exam. / Monthly.  Influenza vaccine. / Every year.  Tetanus, diphtheria, and acellular pertussis (Tdap/Td) vaccine.** / 1 dose of Td every 10 years.  Varicella vaccine.** / Consult your health care provider.  Zoster vaccine.** / 1 dose for adults aged 61 years or older.  Pneumococcal 13-valent conjugate (PCV13) vaccine.** / Consult your health care provider.  Pneumococcal  polysaccharide (PPSV23) vaccine.** / 1 dose for all adults aged 28 years and older.  Meningococcal vaccine.** / Consult your health care provider.  Hepatitis A vaccine.** / Consult your health care provider.  Hepatitis B vaccine.** / Consult your health care provider.  Haemophilus influenzae type b (Hib) vaccine.** / Consult your health care provider. ** Family history and personal history of risk and conditions may change your health care provider's recommendations. Document Released: 11/28/2001 Document Revised: 02/16/2014 Document Reviewed: 02/27/2011 [facility] Patient Information 2015 [place], [place]. This information is not intended to replace advice given to you by your health care provider. Make sure you discuss any questions you have with your health care provider.

## 2014-06-12 NOTE — Progress Notes (Signed)
Pre visit review using our clinic review tool, if applicable. No additional management support is needed unless otherwise documented below in the visit note. 

## 2014-06-12 NOTE — Progress Notes (Signed)
Subjective:     Kaitlyn Perez is a 58 y.o. female and is here for a comprehensive physical exam. The patient reports no problems.  History   Social History  . Marital Status: Married    Spouse Name: Felipa Laroche    Number of Children: N/A  . Years of Education: 68   Occupational History  . exec VP [employer]   Social History Main Topics  . Smoking status: Never Smoker   . Smokeless tobacco: Never Used  . Alcohol Use: 0.0 oz/week     Comment: rare--1 x a month  . Drug Use: No  . Sexual Activity: Yes    Partners: Male   Other Topics Concern  . Not on file   Social History Narrative   Exercise--- no   Health Maintenance  Topic Date Due  . Influenza Vaccine  05/16/2014  . Mammogram  10/31/2014  . Pap Smear  11/13/2014  . Colonoscopy  05/29/2017  . Tetanus/tdap  01/10/2023    The following portions of the patient's history were reviewed and updated as appropriate:  She  has a past medical history of Cancer (2006). She  does not have any pertinent problems on file. She  has past surgical history that includes Nasal sinus surgery (2001); Tonsillectomy and adenoidectomy (1967); and Mandible surgery (Bilateral, 08/2012). Her family history includes Arthritis in her mother; Breast cancer in her maternal aunt and another family member; Cancer in her brother; Cancer (age of onset: 65) in her mother; Colon cancer in her paternal uncle; Diabetes in her father; Heart disease (age of onset: 35) in her maternal grandfather; Heart disease (age of onset: 61) in her maternal grandmother; Hyperlipidemia in her father and mother; Hypertension in her father; Hypotension in her mother; Lung cancer in her paternal uncle; Stroke in her father; Uterine cancer in her paternal aunt. She  reports that she has never smoked. She has never used smokeless tobacco. She reports that she drinks alcohol. She reports that she does not use illicit drugs. She has a current medication list which includes  the following prescription(s): b complex vitamins, flaxseed (linseed), premarin, and probiotic product. Current Outpatient Prescriptions on File Prior to Visit  Medication Sig Dispense Refill  . b complex vitamins tablet Take 1 tablet by mouth daily.      . Flaxseed, Linseed, (FLAX SEEDS PO) Take by mouth.      Marland Kitchen PREMARIN vaginal cream PLACE 1.76 (1/4) APPLICATORFUL VAGINALLYTWO TIMES A WEEK  42.5 g  1  . Probiotic Product (PRO-BIOTIC BLEND PO) Take by mouth.       No current facility-administered medications on file prior to visit.   She is allergic to neosporin..  Review of Systems Review of Systems  Constitutional: Negative for activity change, appetite change and fatigue.  HENT: Negative for hearing loss, congestion, tinnitus and ear discharge.  dentist q51m Eyes: Negative for visual disturbance (see optho q1y -- vision corrected to 20/20 with glasses).  Respiratory: Negative for cough, chest tightness and shortness of breath.   Cardiovascular: Negative for chest pain, palpitations and leg swelling.  Gastrointestinal: Negative for abdominal pain, diarrhea, constipation and abdominal distention.  Genitourinary: Negative for urgency, frequency, decreased urine volume and difficulty urinating.  Musculoskeletal: Negative for back pain, arthralgias and gait problem.  Skin: Negative for color change, pallor and rash.  Neurological: Negative for dizziness, light-headedness, numbness and headaches.  Hematological: Negative for adenopathy. Does not bruise/bleed easily.  Psychiatric/Behavioral: Negative for suicidal ideas, confusion, sleep disturbance, self-injury,  dysphoric mood, decreased concentration and agitation.       Objective:    BP 134/90  Pulse 59  Temp(Src) 97.7 F (36.5 C) (Oral)  Ht 5' 3.75" (1.619 m)  Wt 138 lb 6.4 oz (62.778 kg)  BMI 23.95 kg/m2  SpO2 96% General appearance: alert, cooperative, appears stated age and no distress Head: Normocephalic, without obvious  abnormality, atraumatic Eyes: conjunctivae/corneas clear. PERRL, EOM's intact. Fundi benign. Ears: normal TM's and external ear canals both ears Nose: Nares normal. Septum midline. Mucosa normal. No drainage or sinus tenderness. Throat: lips, mucosa, and tongue normal; teeth and gums normal Neck: no adenopathy, no carotid bruit, no JVD, supple, symmetrical, trachea midline and thyroid not enlarged, symmetric, no tenderness/mass/nodules Back: symmetric, no curvature. ROM normal. No CVA tenderness. Lungs: clear to auscultation bilaterally Breasts: normal appearance, no masses or tenderness Heart: regular rate and rhythm, S1, S2 normal, no murmur, click, rub or gallop Abdomen: soft, non-tender; bowel sounds normal; no masses,  no organomegaly Pelvic: deferred Extremities: extremities normal, atraumatic, no cyanosis or edema-- numbness R foot laterally-- and bunions b/l  Pulses: 2+ and symmetric Skin: Skin color, texture, turgor normal. No rashes or lesions Lymph nodes: Cervical, supraclavicular, and axillary nodes normal. Neurologic: Alert and oriented X 3, normal strength and tone. Normal symmetric reflexes. Normal coordination and gait Psych--no depression, no anxiety      Assessment:    Healthy female exam.      Plan:    ghm utd Check labs See After Visit Summary for Counseling Recommendations   1. Preventative health care  - Basic metabolic panel - CBC with Differential - Hepatic function panel - Lipid panel - POCT urinalysis dipstick - TSH  2. Menopause  - conjugated estrogens (PREMARIN) vaginal cream; PLACE 1.61 (1/4) APPLICATORFUL VAGINALLYTWO TIMES A WEEK  Dispense: 42.5 g; Refill: 1  3. Numbness of foot Pt will make appointment with podiatry

## 2014-12-17 ENCOUNTER — Encounter: Payer: Self-pay | Admitting: Family Medicine

## 2015-02-15 ENCOUNTER — Other Ambulatory Visit: Payer: Self-pay | Admitting: Dermatology

## 2015-03-26 ENCOUNTER — Other Ambulatory Visit: Payer: Self-pay | Admitting: Family Medicine

## 2015-10-05 ENCOUNTER — Ambulatory Visit (INDEPENDENT_AMBULATORY_CARE_PROVIDER_SITE_OTHER): Payer: BC Managed Care – PPO

## 2015-10-05 DIAGNOSIS — Z23 Encounter for immunization: Secondary | ICD-10-CM | POA: Diagnosis not present

## 2016-01-07 ENCOUNTER — Encounter: Payer: Self-pay | Admitting: Behavioral Health

## 2016-01-07 ENCOUNTER — Telehealth: Payer: Self-pay | Admitting: Behavioral Health

## 2016-01-07 NOTE — Addendum Note (Signed)
Addended by: Kathlen Brunswick on: 01/07/2016 03:49 PM   Modules accepted: Orders, Medications

## 2016-01-07 NOTE — Telephone Encounter (Signed)
Pre-Visit Call completed with patient and chart updated.   Pre-Visit Info documented in Specialty Comments under [employer].    

## 2016-01-07 NOTE — Telephone Encounter (Signed)
Unable to reach patient at time of Pre-Visit Call.  Left message for patient to return call when available.    

## 2016-01-10 ENCOUNTER — Ambulatory Visit (INDEPENDENT_AMBULATORY_CARE_PROVIDER_SITE_OTHER): Payer: BC Managed Care – PPO | Admitting: Family Medicine

## 2016-01-10 ENCOUNTER — Encounter: Payer: Self-pay | Admitting: Family Medicine

## 2016-01-10 ENCOUNTER — Other Ambulatory Visit (HOSPITAL_COMMUNITY)
Admission: RE | Admit: 2016-01-10 | Discharge: 2016-01-10 | Disposition: A | Discharge status: Home / Self Care | Payer: BC Managed Care – PPO | Source: Ambulatory Visit | Attending: Family Medicine | Admitting: Family Medicine

## 2016-01-10 VITALS — BP 112/68 | HR 50 | Temp 98.1°F | Ht 64.0 in | Wt 138.2 lb

## 2016-01-10 DIAGNOSIS — Z0000 Encounter for general adult medical examination without abnormal findings: Secondary | ICD-10-CM | POA: Diagnosis not present

## 2016-01-10 DIAGNOSIS — Z1159 Encounter for screening for other viral diseases: Secondary | ICD-10-CM

## 2016-01-10 DIAGNOSIS — E2839 Other primary ovarian failure: Secondary | ICD-10-CM

## 2016-01-10 DIAGNOSIS — Z01419 Encounter for gynecological examination (general) (routine) without abnormal findings: Secondary | ICD-10-CM | POA: Diagnosis present

## 2016-01-10 DIAGNOSIS — Z1151 Encounter for screening for human papillomavirus (HPV): Secondary | ICD-10-CM | POA: Insufficient documentation

## 2016-01-10 DIAGNOSIS — T7020XA Unspecified effects of high altitude, initial encounter: Secondary | ICD-10-CM

## 2016-01-10 DIAGNOSIS — Z124 Encounter for screening for malignant neoplasm of cervix: Secondary | ICD-10-CM

## 2016-01-10 DIAGNOSIS — T7029XA Other effects of high altitude, initial encounter: Secondary | ICD-10-CM

## 2016-01-10 LAB — POCT URINALYSIS DIPSTICK
BILIRUBIN UA: NEGATIVE
GLUCOSE UA: NEGATIVE
KETONES UA: NEGATIVE
LEUKOCYTES UA: NEGATIVE
NITRITE UA: NEGATIVE
PH UA: 6
Protein, UA: NEGATIVE
RBC UA: NEGATIVE
Spec Grav, UA: 1.025
Urobilinogen, UA: 0.2

## 2016-01-10 LAB — HEPATITIS C ANTIBODY: HCV Ab: NEGATIVE

## 2016-01-10 MED ORDER — DEXAMETHASONE 4 MG PO TABS: 4.0000 mg | ORAL_TABLET | Freq: Four times a day (QID) | ORAL | Status: DC

## 2016-01-10 MED ORDER — ESTROGENS, CONJUGATED 0.625 MG/GM VA CREA: TOPICAL_CREAM | VAGINAL | Status: DC

## 2016-01-10 NOTE — Patient Instructions (Signed)
Preventive Care for Adults, Female A healthy lifestyle and preventive care can promote health and wellness. Preventive health guidelines for women include the following key practices.  A routine yearly physical is a good way to check with your health care provider about your health and preventive screening. It is a chance to share any concerns and updates on your health and to receive a thorough exam.  Visit your dentist for a routine exam and preventive care every 6 months. Brush your teeth twice a day and floss once a day. Good oral hygiene prevents tooth decay and gum disease.  The frequency of eye exams is based on your age, health, family medical history, use of contact lenses, and other factors. Follow your health care provider's recommendations for frequency of eye exams.  Eat a healthy diet. Foods like vegetables, fruits, whole grains, low-fat dairy products, and lean protein foods contain the nutrients you need without too many calories. Decrease your intake of foods high in solid fats, added sugars, and salt. Eat the right amount of calories for you.Get information about a proper diet from your health care provider, if necessary.  Regular physical exercise is one of the most important things you can do for your health. Most adults should get at least 150 minutes of moderate-intensity exercise (any activity that increases your heart rate and causes you to sweat) each week. In addition, most adults need muscle-strengthening exercises on 2 or more days a week.  Maintain a healthy weight. The body mass index (BMI) is a screening tool to identify possible weight problems. It provides an estimate of body fat based on height and weight. Your health care provider can find your BMI and can help you achieve or maintain a healthy weight.For adults 20 years and older:  A BMI below 18.5 is considered underweight.  A BMI of 18.5 to 24.9 is normal.  A BMI of 25 to 29.9 is considered overweight.  A  BMI of 30 and above is considered obese.  Maintain normal blood lipids and cholesterol levels by exercising and minimizing your intake of saturated fat. Eat a balanced diet with plenty of fruit and vegetables. Blood tests for lipids and cholesterol should begin at age 45 and be repeated every 5 years. If your lipid or cholesterol levels are high, you are over 50, or you are at high risk for heart disease, you may need your cholesterol levels checked more frequently.Ongoing high lipid and cholesterol levels should be treated with medicines if diet and exercise are not working.  If you smoke, find out from your health care provider how to quit. If you do not use tobacco, do not start.  Lung cancer screening is recommended for adults aged 45-80 years who are at high risk for developing lung cancer because of a history of smoking. A yearly low-dose CT scan of the lungs is recommended for people who have at least a 30-pack-year history of smoking and are a current smoker or have quit within the past 15 years. A pack year of smoking is smoking an average of 1 pack of cigarettes a day for 1 year (for example: 1 pack a day for 30 years or 2 packs a day for 15 years). Yearly screening should continue until the smoker has stopped smoking for at least 15 years. Yearly screening should be stopped for people who develop a health problem that would prevent them from having lung cancer treatment.  If you are pregnant, do not drink alcohol. If you are  breastfeeding, be very cautious about drinking alcohol. If you are not pregnant and choose to drink alcohol, do not have more than 1 drink per day. One drink is considered to be 12 ounces (355 mL) of beer, 5 ounces (148 mL) of wine, or 1.5 ounces (44 mL) of liquor.  Avoid use of street drugs. Do not share needles with anyone. Ask for help if you need support or instructions about stopping the use of drugs.  High blood pressure causes heart disease and increases the risk  of stroke. Your blood pressure should be checked at least every 1 to 2 years. Ongoing high blood pressure should be treated with medicines if weight loss and exercise do not work.  If you are 55-79 years old, ask your health care provider if you should take aspirin to prevent strokes.  Diabetes screening is done by taking a blood sample to check your blood glucose level after you have not eaten for a certain period of time (fasting). If you are not overweight and you do not have risk factors for diabetes, you should be screened once every 3 years starting at age 45. If you are overweight or obese and you are 40-70 years of age, you should be screened for diabetes every year as part of your cardiovascular risk assessment.  Breast cancer screening is essential preventive care for women. You should practice "breast self-awareness." This means understanding the normal appearance and feel of your breasts and may include breast self-examination. Any changes detected, no matter how small, should be reported to a health care provider. Women in their 20s and 30s should have a clinical breast exam (CBE) by a health care provider as part of a regular health exam every 1 to 3 years. After age 40, women should have a CBE every year. Starting at age 40, women should consider having a mammogram (breast X-ray test) every year. Women who have a family history of breast cancer should talk to their health care provider about genetic screening. Women at a high risk of breast cancer should talk to their health care providers about having an MRI and a mammogram every year.  Breast cancer gene (BRCA)-related cancer risk assessment is recommended for women who have family members with BRCA-related cancers. BRCA-related cancers include breast, ovarian, tubal, and peritoneal cancers. Having family members with these cancers may be associated with an increased risk for harmful changes (mutations) in the breast cancer genes BRCA1 and  BRCA2. Results of the assessment will determine the need for genetic counseling and BRCA1 and BRCA2 testing.  Your health care provider may recommend that you be screened regularly for cancer of the pelvic organs (ovaries, uterus, and vagina). This screening involves a pelvic examination, including checking for microscopic changes to the surface of your cervix (Pap test). You may be encouraged to have this screening done every 3 years, beginning at age 21.  For women ages 30-65, health care providers may recommend pelvic exams and Pap testing every 3 years, or they may recommend the Pap and pelvic exam, combined with testing for human papilloma virus (HPV), every 5 years. Some types of HPV increase your risk of cervical cancer. Testing for HPV may also be done on women of any age with unclear Pap test results.  Other health care providers may not recommend any screening for nonpregnant women who are considered low risk for pelvic cancer and who do not have symptoms. Ask your health care provider if a screening pelvic exam is right for   you.  If you have had past treatment for cervical cancer or a condition that could lead to cancer, you need Pap tests and screening for cancer for at least 20 years after your treatment. If Pap tests have been discontinued, your risk factors (such as having a new sexual partner) need to be reassessed to determine if screening should resume. Some women have medical problems that increase the chance of getting cervical cancer. In these cases, your health care provider may recommend more frequent screening and Pap tests.  Colorectal cancer can be detected and often prevented. Most routine colorectal cancer screening begins at the age of 50 years and continues through age 75 years. However, your health care provider may recommend screening at an earlier age if you have risk factors for colon cancer. On a yearly basis, your health care provider may provide home test kits to check  for hidden blood in the stool. Use of a small camera at the end of a tube, to directly examine the colon (sigmoidoscopy or colonoscopy), can detect the earliest forms of colorectal cancer. Talk to your health care provider about this at age 50, when routine screening begins. Direct exam of the colon should be repeated every 5-10 years through age 75 years, unless early forms of precancerous polyps or small growths are found.  People who are at an increased risk for hepatitis B should be screened for this virus. You are considered at high risk for hepatitis B if:  You were born in a country where hepatitis B occurs often. Talk with your health care provider about which countries are considered high risk.  Your parents were born in a high-risk country and you have not received a shot to protect against hepatitis B (hepatitis B vaccine).  You have HIV or AIDS.  You use needles to inject street drugs.  You live with, or have sex with, someone who has hepatitis B.  You get hemodialysis treatment.  You take certain medicines for conditions like cancer, organ transplantation, and autoimmune conditions.  Hepatitis C blood testing is recommended for all people born from 1945 through 1965 and any individual with known risks for hepatitis C.  Practice safe sex. Use condoms and avoid high-risk sexual practices to reduce the spread of sexually transmitted infections (STIs). STIs include gonorrhea, chlamydia, syphilis, trichomonas, herpes, HPV, and human immunodeficiency virus (HIV). Herpes, HIV, and HPV are viral illnesses that have no cure. They can result in disability, cancer, and death.  You should be screened for sexually transmitted illnesses (STIs) including gonorrhea and chlamydia if:  You are sexually active and are younger than 24 years.  You are older than 24 years and your health care provider tells you that you are at risk for this type of infection.  Your sexual activity has changed  since you were last screened and you are at an increased risk for chlamydia or gonorrhea. Ask your health care provider if you are at risk.  If you are at risk of being infected with HIV, it is recommended that you take a prescription medicine daily to prevent HIV infection. This is called preexposure prophylaxis (PrEP). You are considered at risk if:  You are sexually active and do not regularly use condoms or know the HIV status of your partner(s).  You take drugs by injection.  You are sexually active with a partner who has HIV.  Talk with your health care provider about whether you are at high risk of being infected with HIV. If   you choose to begin PrEP, you should first be tested for HIV. You should then be tested every 3 months for as long as you are taking PrEP.  Osteoporosis is a disease in which the bones lose minerals and strength with aging. This can result in serious bone fractures or breaks. The risk of osteoporosis can be identified using a bone density scan. Women ages 67 years and over and women at risk for fractures or osteoporosis should discuss screening with their health care providers. Ask your health care provider whether you should take a calcium supplement or vitamin D to reduce the rate of osteoporosis.  Menopause can be associated with physical symptoms and risks. Hormone replacement therapy is available to decrease symptoms and risks. You should talk to your health care provider about whether hormone replacement therapy is right for you.  Use sunscreen. Apply sunscreen liberally and repeatedly throughout the day. You should seek shade when your shadow is shorter than you. Protect yourself by wearing long sleeves, pants, a wide-brimmed hat, and sunglasses year round, whenever you are outdoors.  Once a month, do a whole body skin exam, using a mirror to look at the skin on your back. Tell your health care provider of new moles, moles that have irregular borders, moles that  are larger than a pencil eraser, or moles that have changed in shape or color.  Stay current with required vaccines (immunizations).  Influenza vaccine. All adults should be immunized every year.  Tetanus, diphtheria, and acellular pertussis (Td, Tdap) vaccine. Pregnant women should receive 1 dose of Tdap vaccine during each pregnancy. The dose should be obtained regardless of the length of time since the last dose. Immunization is preferred during the 27th-36th week of gestation. An adult who has not previously received Tdap or who does not know her vaccine status should receive 1 dose of Tdap. This initial dose should be followed by tetanus and diphtheria toxoids (Td) booster doses every 10 years. Adults with an unknown or incomplete history of completing a 3-dose immunization series with Td-containing vaccines should begin or complete a primary immunization series including a Tdap dose. Adults should receive a Td booster every 10 years.  Varicella vaccine. An adult without evidence of immunity to varicella should receive 2 doses or a second dose if she has previously received 1 dose. Pregnant females who do not have evidence of immunity should receive the first dose after pregnancy. This first dose should be obtained before leaving the health care facility. The second dose should be obtained 4-8 weeks after the first dose.  Human papillomavirus (HPV) vaccine. Females aged 13-26 years who have not received the vaccine previously should obtain the 3-dose series. The vaccine is not recommended for use in pregnant females. However, pregnancy testing is not needed before receiving a dose. If a female is found to be pregnant after receiving a dose, no treatment is needed. In that case, the remaining doses should be delayed until after the pregnancy. Immunization is recommended for any person with an immunocompromised condition through the age of 61 years if she did not get any or all doses earlier. During the  3-dose series, the second dose should be obtained 4-8 weeks after the first dose. The third dose should be obtained 24 weeks after the first dose and 16 weeks after the second dose.  Zoster vaccine. One dose is recommended for adults aged 30 years or older unless certain conditions are present.  Measles, mumps, and rubella (MMR) vaccine. Adults born  before 1957 generally are considered immune to measles and mumps. Adults born in 1957 or later should have 1 or more doses of MMR vaccine unless there is a contraindication to the vaccine or there is laboratory evidence of immunity to each of the three diseases. A routine second dose of MMR vaccine should be obtained at least 28 days after the first dose for students attending postsecondary schools, health care workers, or international travelers. People who received inactivated measles vaccine or an unknown type of measles vaccine during 1963-1967 should receive 2 doses of MMR vaccine. People who received inactivated mumps vaccine or an unknown type of mumps vaccine before 1979 and are at high risk for mumps infection should consider immunization with 2 doses of MMR vaccine. For females of childbearing age, rubella immunity should be determined. If there is no evidence of immunity, females who are not pregnant should be vaccinated. If there is no evidence of immunity, females who are pregnant should delay immunization until after pregnancy. Unvaccinated health care workers born before 1957 who lack laboratory evidence of measles, mumps, or rubella immunity or laboratory confirmation of disease should consider measles and mumps immunization with 2 doses of MMR vaccine or rubella immunization with 1 dose of MMR vaccine.  Pneumococcal 13-valent conjugate (PCV13) vaccine. When indicated, a person who is uncertain of his immunization history and has no record of immunization should receive the PCV13 vaccine. All adults 65 years of age and older should receive this  vaccine. An adult aged 19 years or older who has certain medical conditions and has not been previously immunized should receive 1 dose of PCV13 vaccine. This PCV13 should be followed with a dose of pneumococcal polysaccharide (PPSV23) vaccine. Adults who are at high risk for pneumococcal disease should obtain the PPSV23 vaccine at least 8 weeks after the dose of PCV13 vaccine. Adults older than 60 years of age who have normal immune system function should obtain the PPSV23 vaccine dose at least 1 year after the dose of PCV13 vaccine.  Pneumococcal polysaccharide (PPSV23) vaccine. When PCV13 is also indicated, PCV13 should be obtained first. All adults aged 65 years and older should be immunized. An adult younger than age 65 years who has certain medical conditions should be immunized. Any person who resides in a nursing home or long-term care facility should be immunized. An adult smoker should be immunized. People with an immunocompromised condition and certain other conditions should receive both PCV13 and PPSV23 vaccines. People with human immunodeficiency virus (HIV) infection should be immunized as soon as possible after diagnosis. Immunization during chemotherapy or radiation therapy should be avoided. Routine use of PPSV23 vaccine is not recommended for American Indians, [place] Natives, or people younger than 65 years unless there are medical conditions that require PPSV23 vaccine. When indicated, people who have unknown immunization and have no record of immunization should receive PPSV23 vaccine. One-time revaccination 5 years after the first dose of PPSV23 is recommended for people aged 19-64 years who have chronic kidney failure, nephrotic syndrome, asplenia, or immunocompromised conditions. People who received 1-2 doses of PPSV23 before age 65 years should receive another dose of PPSV23 vaccine at age 65 years or later if at least 5 years have passed since the previous dose. Doses of PPSV23 are not  needed for people immunized with PPSV23 at or after age 65 years.  Meningococcal vaccine. Adults with asplenia or persistent complement component deficiencies should receive 2 doses of quadrivalent meningococcal conjugate (MenACWY-D) vaccine. The doses should be obtained   at least 2 months apart. Microbiologists working with certain meningococcal bacteria, [place] recruits, people at risk during an outbreak, and people who travel to or live in countries with a high rate of meningitis should be immunized. A first-year college student up through age 34 years who is living in a residence hall should receive a dose if she did not receive a dose on or after her 16th birthday. Adults who have certain high-risk conditions should receive one or more doses of vaccine.  Hepatitis A vaccine. Adults who wish to be protected from this disease, have certain high-risk conditions, work with hepatitis A-infected animals, work in hepatitis A research labs, or travel to or work in countries with a high rate of hepatitis A should be immunized. Adults who were previously unvaccinated and who anticipate close contact with an international adoptee during the first 60 days after arrival in the [place] from a country with a high rate of hepatitis A should be immunized.  Hepatitis B vaccine. Adults who wish to be protected from this disease, have certain high-risk conditions, may be exposed to blood or other infectious body fluids, are household contacts or sex partners of hepatitis B positive people, are clients or workers in certain care facilities, or travel to or work in countries with a high rate of hepatitis B should be immunized.  Haemophilus influenzae type b (Hib) vaccine. A previously unvaccinated person with asplenia or sickle cell disease or having a scheduled splenectomy should receive 1 dose of Hib vaccine. Regardless of previous immunization, a recipient of a hematopoietic stem cell transplant should receive a  3-dose series 6-12 months after her successful transplant. Hib vaccine is not recommended for adults with HIV infection. Preventive Services / Frequency Ages 35 to 4 years  Blood pressure check.** / Every 3-5 years.  Lipid and cholesterol check.** / Every 5 years beginning at age 60.  Clinical breast exam.** / Every 3 years for women in their 71s and 10s.  BRCA-related cancer risk assessment.** / For women who have family members with a BRCA-related cancer (breast, ovarian, tubal, or peritoneal cancers).  Pap test.** / Every 2 years from ages 76 through 26. Every 3 years starting at age 61 through age 76 or 93 with a history of 3 consecutive normal Pap tests.  HPV screening.** / Every 3 years from ages 37 through ages 60 to 51 with a history of 3 consecutive normal Pap tests.  Hepatitis C blood test.** / For any individual with known risks for hepatitis C.  Skin self-exam. / Monthly.  Influenza vaccine. / Every year.  Tetanus, diphtheria, and acellular pertussis (Tdap, Td) vaccine.** / Consult your health care provider. Pregnant women should receive 1 dose of Tdap vaccine during each pregnancy. 1 dose of Td every 10 years.  Varicella vaccine.** / Consult your health care provider. Pregnant females who do not have evidence of immunity should receive the first dose after pregnancy.  HPV vaccine. / 3 doses over 6 months, if 93 and younger. The vaccine is not recommended for use in pregnant females. However, pregnancy testing is not needed before receiving a dose.  Measles, mumps, rubella (MMR) vaccine.** / You need at least 1 dose of MMR if you were born in 1957 or later. You may also need a 2nd dose. For females of childbearing age, rubella immunity should be determined. If there is no evidence of immunity, females who are not pregnant should be vaccinated. If there is no evidence of immunity, females who are  pregnant should delay immunization until after pregnancy.  Pneumococcal  13-valent conjugate (PCV13) vaccine.** / Consult your health care provider.  Pneumococcal polysaccharide (PPSV23) vaccine.** / 1 to 2 doses if you smoke cigarettes or if you have certain conditions.  Meningococcal vaccine.** / 1 dose if you are age 68 to 8 years and a Market researcher living in a residence hall, or have one of several medical conditions, you need to get vaccinated against meningococcal disease. You may also need additional booster doses.  Hepatitis A vaccine.** / Consult your health care provider.  Hepatitis B vaccine.** / Consult your health care provider.  Haemophilus influenzae type b (Hib) vaccine.** / Consult your health care provider. Ages 7 to 53 years  Blood pressure check.** / Every year.  Lipid and cholesterol check.** / Every 5 years beginning at age 25 years.  Lung cancer screening. / Every year if you are aged 11-80 years and have a 30-pack-year history of smoking and currently smoke or have quit within the past 15 years. Yearly screening is stopped once you have quit smoking for at least 15 years or develop a health problem that would prevent you from having lung cancer treatment.  Clinical breast exam.** / Every year after age 48 years.  BRCA-related cancer risk assessment.** / For women who have family members with a BRCA-related cancer (breast, ovarian, tubal, or peritoneal cancers).  Mammogram.** / Every year beginning at age 41 years and continuing for as long as you are in good health. Consult with your health care provider.  Pap test.** / Every 3 years starting at age 65 years through age 37 or 70 years with a history of 3 consecutive normal Pap tests.  HPV screening.** / Every 3 years from ages 72 years through ages 60 to 40 years with a history of 3 consecutive normal Pap tests.  Fecal occult blood test (FOBT) of stool. / Every year beginning at age 21 years and continuing until age 5 years. You may not need to do this test if you get  a colonoscopy every 10 years.  Flexible sigmoidoscopy or colonoscopy.** / Every 5 years for a flexible sigmoidoscopy or every 10 years for a colonoscopy beginning at age 35 years and continuing until age 48 years.  Hepatitis C blood test.** / For all people born from [birth year] through 1965 and any individual with known risks for hepatitis C.  Skin self-exam. / Monthly.  Influenza vaccine. / Every year.  Tetanus, diphtheria, and acellular pertussis (Tdap/Td) vaccine.** / Consult your health care provider. Pregnant women should receive 1 dose of Tdap vaccine during each pregnancy. 1 dose of Td every 10 years.  Varicella vaccine.** / Consult your health care provider. Pregnant females who do not have evidence of immunity should receive the first dose after pregnancy.  Zoster vaccine.** / 1 dose for adults aged 30 years or older.  Measles, mumps, rubella (MMR) vaccine.** / You need at least 1 dose of MMR if you were born in 1957 or later. You may also need a second dose. For females of childbearing age, rubella immunity should be determined. If there is no evidence of immunity, females who are not pregnant should be vaccinated. If there is no evidence of immunity, females who are pregnant should delay immunization until after pregnancy.  Pneumococcal 13-valent conjugate (PCV13) vaccine.** / Consult your health care provider.  Pneumococcal polysaccharide (PPSV23) vaccine.** / 1 to 2 doses if you smoke cigarettes or if you have certain conditions.  Meningococcal vaccine.** /  Consult your health care provider.  Hepatitis A vaccine.** / Consult your health care provider.  Hepatitis B vaccine.** / Consult your health care provider.  Haemophilus influenzae type b (Hib) vaccine.** / Consult your health care provider. Ages 64 years and over  Blood pressure check.** / Every year.  Lipid and cholesterol check.** / Every 5 years beginning at age 23 years.  Lung cancer screening. / Every year if you  are aged 16-80 years and have a 30-pack-year history of smoking and currently smoke or have quit within the past 15 years. Yearly screening is stopped once you have quit smoking for at least 15 years or develop a health problem that would prevent you from having lung cancer treatment.  Clinical breast exam.** / Every year after age 74 years.  BRCA-related cancer risk assessment.** / For women who have family members with a BRCA-related cancer (breast, ovarian, tubal, or peritoneal cancers).  Mammogram.** / Every year beginning at age 44 years and continuing for as long as you are in good health. Consult with your health care provider.  Pap test.** / Every 3 years starting at age 58 years through age 22 or 39 years with 3 consecutive normal Pap tests. Testing can be stopped between 65 and 70 years with 3 consecutive normal Pap tests and no abnormal Pap or HPV tests in the past 10 years.  HPV screening.** / Every 3 years from ages 64 years through ages 70 or 61 years with a history of 3 consecutive normal Pap tests. Testing can be stopped between 65 and 70 years with 3 consecutive normal Pap tests and no abnormal Pap or HPV tests in the past 10 years.  Fecal occult blood test (FOBT) of stool. / Every year beginning at age 40 years and continuing until age 27 years. You may not need to do this test if you get a colonoscopy every 10 years.  Flexible sigmoidoscopy or colonoscopy.** / Every 5 years for a flexible sigmoidoscopy or every 10 years for a colonoscopy beginning at age 7 years and continuing until age 32 years.  Hepatitis C blood test.** / For all people born from [birth year] through 1965 and any individual with known risks for hepatitis C.  Osteoporosis screening.** / A one-time screening for women ages 30 years and over and women at risk for fractures or osteoporosis.  Skin self-exam. / Monthly.  Influenza vaccine. / Every year.  Tetanus, diphtheria, and acellular pertussis (Tdap/Td)  vaccine.** / 1 dose of Td every 10 years.  Varicella vaccine.** / Consult your health care provider.  Zoster vaccine.** / 1 dose for adults aged 35 years or older.  Pneumococcal 13-valent conjugate (PCV13) vaccine.** / Consult your health care provider.  Pneumococcal polysaccharide (PPSV23) vaccine.** / 1 dose for all adults aged 46 years and older.  Meningococcal vaccine.** / Consult your health care provider.  Hepatitis A vaccine.** / Consult your health care provider.  Hepatitis B vaccine.** / Consult your health care provider.  Haemophilus influenzae type b (Hib) vaccine.** / Consult your health care provider. ** Family history and personal history of risk and conditions may change your health care provider's recommendations.   This information is not intended to replace advice given to you by your health care provider. Make sure you discuss any questions you have with your health care provider.   Document Released: 11/28/2001 Document Revised: 10/23/2014 Document Reviewed: 02/27/2011 Elsevier Interactive Patient Education [employer].

## 2016-01-10 NOTE — Progress Notes (Signed)
Pre visit review using our clinic review tool, if applicable. No additional management support is needed unless otherwise documented below in the visit note. 

## 2016-01-10 NOTE — Progress Notes (Signed)
Subjective:     Kaitlyn Perez is a 60 y.o. female and is here for a comprehensive physical exam. The patient reports no problems.  Social History   Social History  . Marital Status: Married    Spouse Name: Cristene Reckers  . Number of Children: N/A  . Years of Education: 30   Occupational History  . exec VP [employer]   Social History Main Topics  . Smoking status: Never Smoker   . Smokeless tobacco: Never Used  . Alcohol Use: 0.0 oz/week     Comment: rare--1 x a month  . Drug Use: No  . Sexual Activity:    Partners: Male   Other Topics Concern  . Not on file   Social History Narrative   Exercise--- no   Health Maintenance  Topic Date Due  . Hepatitis C Screening  12/[birth year]/1955  . HIV Screening  06/11/1971  . PAP SMEAR  11/13/2014  . MAMMOGRAM  12/08/2015  . INFLUENZA VACCINE  05/16/2016  . COLONOSCOPY  05/29/2017  . TETANUS/TDAP  01/10/2023    The following portions of the patient's history were reviewed and updated as appropriate:  She  has a past medical history of Cancer ([place]) (2006). She  does not have any pertinent problems on file. She  has past surgical history that includes Nasal sinus surgery (2001); Tonsillectomy and adenoidectomy (1967); and Mandible surgery (Bilateral, 08/2012). Her family history includes Arthritis in her mother; Breast cancer in her maternal aunt; Cancer in her brother; Cancer (age of onset: 72) in her mother; Colon cancer in her paternal uncle; Diabetes in her father; Heart disease (age of onset: 51) in her maternal grandfather; Heart disease (age of onset: 48) in her maternal grandmother; Hyperlipidemia in her father and mother; Hypertension in her father; Hypotension in her mother; Lung cancer in her paternal uncle; Stroke in her father; Uterine cancer in her paternal aunt. She  reports that she has never smoked. She has never used smokeless tobacco. She reports that she drinks alcohol. She reports that she does not use illicit  drugs. She has a current medication list which includes the following prescription(s): biotin, conjugated estrogens, multivitamin-iron-minerals-folic acid, omega-3 acid ethyl esters, probiotic product, and dexamethasone. Current Outpatient Prescriptions on File Prior to Visit  Medication Sig Dispense Refill  . BIOTIN PO Take by mouth daily.    . multivitamin-iron-minerals-folic acid (CENTRUM) chewable tablet Chew 1 tablet by mouth daily.    Marland Kitchen omega-3 acid ethyl esters (LOVAZA) 1 g capsule Take 1 g by mouth daily.    . Probiotic Product (PRO-BIOTIC BLEND PO) Take by mouth.     No current facility-administered medications on file prior to visit.   She is allergic to neosporin..  Review of Systems Review of Systems  Constitutional: Negative for activity change, appetite change and fatigue.  HENT: Negative for hearing loss, congestion, tinnitus and ear discharge.  dentist q77m Eyes: Negative for visual disturbance (see optho q1y -- vision corrected to 20/20 with glasses).  Respiratory: Negative for cough, chest tightness and shortness of breath.   Cardiovascular: Negative for chest pain, palpitations and leg swelling.  Gastrointestinal: Negative for abdominal pain, diarrhea, constipation and abdominal distention.  Genitourinary: Negative for urgency, frequency, decreased urine volume and difficulty urinating.  Musculoskeletal: Negative for back pain, arthralgias and gait problem.  Skin: Negative for color change, pallor and rash.  Neurological: Negative for dizziness, light-headedness, numbness and headaches.  Hematological: Negative for adenopathy. Does not bruise/bleed easily.  Psychiatric/Behavioral: Negative for  suicidal ideas, confusion, sleep disturbance, self-injury, dysphoric mood, decreased concentration and agitation.       Objective:    BP 112/68 mmHg  Pulse 50  Temp(Src) 98.1 F (36.7 C) (Oral)  Ht 5\' 4"  (1.626 m)  Wt 138 lb 3.2 oz (62.687 kg)  BMI 23.71 kg/m2  SpO2  99% General appearance: alert, cooperative, appears stated age and no distress Head: Normocephalic, without obvious abnormality, atraumatic Eyes: conjunctivae/corneas clear. PERRL, EOM's intact. Fundi benign. Ears: normal TM's and external ear canals both ears Nose: Nares normal. Septum midline. Mucosa normal. No drainage or sinus tenderness. Throat: lips, mucosa, and tongue normal; teeth and gums normal Neck: no adenopathy, no carotid bruit, no JVD, supple, symmetrical, trachea midline and thyroid not enlarged, symmetric, no tenderness/mass/nodules Back: symmetric, no curvature. ROM normal. No CVA tenderness. Lungs: clear to auscultation bilaterally Breasts: normal appearance, no masses or tenderness Heart: regular rate and rhythm, S1, S2 normal, no murmur, click, rub or gallop Abdomen: soft, non-tender; bowel sounds normal; no masses,  no organomegaly Pelvic: cervix normal in appearance, external genitalia normal, no adnexal masses or tenderness, no cervical motion tenderness, rectovaginal septum normal, uterus normal size, shape, and consistency, vagina normal without discharge and pap done-- rectal heme neg brown stool Extremities: extremities normal, atraumatic, no cyanosis or edema Pulses: 2+ and symmetric Skin: Skin color, texture, turgor normal. No rashes or lesions Lymph nodes: Cervical, supraclavicular, and axillary nodes normal. Neurologic: Alert and oriented X 3, normal strength and tone. Normal symmetric reflexes. Normal coordination and gait Psych--no depression, no anxiety      Assessment:    Healthy female exam.     Plan:     ghm utd Check labs See After Visit Summary for Counseling Recommendations    1. High altitude sickness, initial encounter   - dexamethasone (DECADRON) 4 MG tablet; Take 1 tablet (4 mg total) by mouth 4 (four) times daily.  Dispense: 30 tablet; Refill: 0  2. Estrogen deficiency   - conjugated estrogens (PREMARIN) vaginal cream; INSERT [id number]  (1/4) APPLICATORFUL VAGINALLY TWICE A WEEK.  Dispense: 30 g; Refill: 2  3. Preventative health care See above - Comprehensive metabolic panel - CBC with Differential/Platelet - Lipid panel - POCT urinalysis dipstick - TSH  4. Need for hepatitis C screening test   - Hepatitis C antibody  5. Screening for malignant neoplasm of cervix   - Cytology - PAP

## 2016-01-11 LAB — CBC WITH DIFFERENTIAL/PLATELET
BASOS ABS: 0 10*3/uL (ref 0.0–0.1)
Basophils Relative: 0.4 % (ref 0.0–3.0)
EOS PCT: 0.5 % (ref 0.0–5.0)
Eosinophils Absolute: 0 10*3/uL (ref 0.0–0.7)
HEMATOCRIT: 43.2 % (ref 36.0–46.0)
HEMOGLOBIN: 14.6 g/dL (ref 12.0–15.0)
LYMPHS PCT: 30.1 % (ref 12.0–46.0)
Lymphs Abs: 1.5 10*3/uL (ref 0.7–4.0)
MCHC: 33.7 g/dL (ref 30.0–36.0)
MCV: 93.7 fl (ref 78.0–100.0)
MONOS PCT: 10 % (ref 3.0–12.0)
Monocytes Absolute: 0.5 10*3/uL (ref 0.1–1.0)
Neutro Abs: 2.9 10*3/uL (ref 1.4–7.7)
Neutrophils Relative %: 59 % (ref 43.0–77.0)
PLATELETS: 270 10*3/uL (ref 150.0–400.0)
RBC: 4.61 Mil/uL (ref 3.87–5.11)
RDW: 13.6 % (ref 11.5–15.5)
WBC: 5 10*3/uL (ref 4.0–10.5)

## 2016-01-11 LAB — TSH: TSH: 1.95 u[IU]/mL (ref 0.35–4.50)

## 2016-01-11 LAB — COMPREHENSIVE METABOLIC PANEL
ALBUMIN: 4.7 g/dL (ref 3.5–5.2)
ALT: 16 U/L (ref 0–35)
AST: 20 U/L (ref 0–37)
Alkaline Phosphatase: 45 U/L (ref 39–117)
BILIRUBIN TOTAL: 0.5 mg/dL (ref 0.2–1.2)
BUN: 18 mg/dL (ref 6–23)
CALCIUM: 10 mg/dL (ref 8.4–10.5)
CO2: 29 mEq/L (ref 19–32)
Chloride: 103 mEq/L (ref 96–112)
Creatinine, Ser: 0.65 mg/dL (ref 0.40–1.20)
GFR: 98.96 mL/min (ref >60.00)
GLUCOSE: 83 mg/dL (ref 70–99)
POTASSIUM: 3.6 meq/L (ref 3.5–5.1)
Sodium: 141 mEq/L (ref 135–145)
TOTAL PROTEIN: 7.5 g/dL (ref 6.0–8.3)

## 2016-01-11 LAB — LIPID PANEL
CHOLESTEROL: 300 mg/dL — AB (ref 0–200)
HDL: 100.7 mg/dL (ref >39.00)
LDL CALC: 178 mg/dL — AB (ref 0–99)
NonHDL: 199.55
TRIGLYCERIDES: 107 mg/dL (ref 0.0–149.0)
Total CHOL/HDL Ratio: 3
VLDL: 21.4 mg/dL (ref 0.0–40.0)

## 2016-01-12 LAB — CYTOLOGY - PAP

## 2016-01-17 LAB — HM MAMMOGRAPHY

## 2016-01-26 ENCOUNTER — Encounter: Payer: Self-pay | Admitting: Family Medicine

## 2016-08-29 ENCOUNTER — Telehealth: Payer: Self-pay | Admitting: Family Medicine

## 2016-08-29 ENCOUNTER — Encounter: Payer: Self-pay | Admitting: Family Medicine

## 2016-08-29 DIAGNOSIS — E2839 Other primary ovarian failure: Secondary | ICD-10-CM

## 2016-08-29 DIAGNOSIS — Z8262 Family history of osteoporosis: Secondary | ICD-10-CM

## 2016-08-29 NOTE — Telephone Encounter (Signed)
Dr Etter Sjogren-- Please see below response from pt regarding DEXA Scan coverage and advise if pt has qualifying diagnosis for testing? Pt currently using Premarin cream. Has never had hysterectomy. States she has family history of osteoporosis.  Spoke with pt. States she has verified that insurance plan covers zostavax. Scheduled nurse visit for 09/21/16 at 9:30am.

## 2016-08-29 NOTE — Telephone Encounter (Signed)
Patient requesting a shingles vaccine. Please advise.    Patient also has questions regarding the Dexa Scan. She states that her insurance will not cover the scan unless it is medically necessary. They will cover it under preventive care after age 60. Please advise.   Patient phone: [phone]

## 2016-08-30 NOTE — Telephone Encounter (Signed)
Left message for pt to return my call.

## 2016-08-30 NOTE — Telephone Encounter (Signed)
What dx was attached to it--- I usually use estrogen deficiency, we can also try fam hx osteoporosis

## 2016-08-31 NOTE — Telephone Encounter (Signed)
Pt is returning call. Tried to assist but not real sure. Pt would like a call back.

## 2016-09-04 NOTE — Telephone Encounter (Signed)
Attempted to reach pt and left to check mychart acct for response regading DEXA.

## 2016-09-13 NOTE — Addendum Note (Signed)
Addended by: Kelle Darting A on: 09/13/2016 04:24 PM   Modules accepted: Orders

## 2016-09-13 NOTE — Telephone Encounter (Signed)
Pt called back and states she spoke with her insurance and was told that because there was medical necessity with these codes (not screening) and that it would be done at a free standing facility, it should be covered.  Order placed at [facility] per pt request.

## 2016-09-21 ENCOUNTER — Ambulatory Visit (INDEPENDENT_AMBULATORY_CARE_PROVIDER_SITE_OTHER): Payer: BC Managed Care – PPO

## 2016-09-21 DIAGNOSIS — Z2911 Encounter for prophylactic immunotherapy for respiratory syncytial virus (RSV): Secondary | ICD-10-CM | POA: Diagnosis not present

## 2016-09-21 DIAGNOSIS — Z23 Encounter for immunization: Secondary | ICD-10-CM | POA: Diagnosis not present

## 2016-09-21 NOTE — Progress Notes (Signed)
Pre visit review using our clinic review tool, if applicable. No additional management support is needed unless otherwise documented below in the visit note.  Pt in for a nurse visit today to receive flu shot and shingles vaccine.  Both vaccines given today.  Flu vaccine given in left deltoid, shingles vaccine in right arm.  Pt tolerated both injections well.  No signs of a reaction noted upon leaving the clinic.

## 2017-01-17 LAB — HM MAMMOGRAPHY

## 2017-01-17 LAB — HM DEXA SCAN

## 2017-01-25 ENCOUNTER — Telehealth: Payer: Self-pay | Admitting: *Deleted

## 2017-01-25 ENCOUNTER — Encounter: Payer: Self-pay | Admitting: *Deleted

## 2017-01-25 NOTE — Telephone Encounter (Signed)
Bone density showed osteopenia.  It is worse than 2002.  Consider Fosamax 70mg  weekly #4 with 11 refills and to recheck in 2 years.  Left message on machine to call back

## 2017-01-26 ENCOUNTER — Encounter: Payer: Self-pay | Admitting: *Deleted

## 2017-01-26 MED ORDER — ALENDRONATE SODIUM 70 MG PO TABS: 70.0000 mg | ORAL_TABLET | ORAL | 11 refills | Status: DC

## 2017-01-26 NOTE — Telephone Encounter (Signed)
Patient notified of results.  Patient agreed to start medication.  She will start medication in May because she is going out of town and do not want to start medication.  Medication sent into pharmacy to put on her profile so she can call when she is ready.

## 2017-09-11 ENCOUNTER — Ambulatory Visit (INDEPENDENT_AMBULATORY_CARE_PROVIDER_SITE_OTHER): Payer: BC Managed Care – PPO

## 2017-09-11 DIAGNOSIS — Z23 Encounter for immunization: Secondary | ICD-10-CM

## 2017-09-11 NOTE — Progress Notes (Signed)
Pre visit review using our clinic tool,if applicable. No additional management support is needed unless otherwise documented below in the visit note.   Patient in for Influenza injection per order from Dr. Carollee Herter. Given IM right deltoid per order from provider.  Patient tolerated well.

## 2017-10-16 DIAGNOSIS — M8430XA Stress fracture, unspecified site, initial encounter for fracture: Secondary | ICD-10-CM

## 2017-10-16 HISTORY — DX: Stress fracture, unspecified site, initial encounter for fracture: M84.30XA

## 2017-12-03 ENCOUNTER — Encounter: Payer: Self-pay | Admitting: Family Medicine

## 2017-12-03 ENCOUNTER — Ambulatory Visit (INDEPENDENT_AMBULATORY_CARE_PROVIDER_SITE_OTHER): Payer: BC Managed Care – PPO | Admitting: Family Medicine

## 2017-12-03 VITALS — BP 118/76 | HR 51 | Temp 98.0°F | Resp 16 | Ht 64.0 in | Wt 135.2 lb

## 2017-12-03 DIAGNOSIS — M8580 Other specified disorders of bone density and structure, unspecified site: Secondary | ICD-10-CM

## 2017-12-03 DIAGNOSIS — Z86010 Personal history of colonic polyps: Secondary | ICD-10-CM

## 2017-12-03 DIAGNOSIS — E2839 Other primary ovarian failure: Secondary | ICD-10-CM

## 2017-12-03 DIAGNOSIS — Z0000 Encounter for general adult medical examination without abnormal findings: Secondary | ICD-10-CM

## 2017-12-03 LAB — COMPREHENSIVE METABOLIC PANEL
ALBUMIN: 4.4 g/dL (ref 3.5–5.2)
ALT: 17 U/L (ref 0–35)
AST: 20 U/L (ref 0–37)
Alkaline Phosphatase: 33 U/L — ABNORMAL LOW (ref 39–117)
BUN: 19 mg/dL (ref 6–23)
CALCIUM: 9.3 mg/dL (ref 8.4–10.5)
CHLORIDE: 105 meq/L (ref 96–112)
CO2: 28 meq/L (ref 19–32)
Creatinine, Ser: 0.75 mg/dL (ref 0.40–1.20)
GFR: 83.36 mL/min (ref >60.00)
Glucose, Bld: 82 mg/dL (ref 70–99)
POTASSIUM: 3.7 meq/L (ref 3.5–5.1)
SODIUM: 140 meq/L (ref 135–145)
Total Bilirubin: 0.8 mg/dL (ref 0.2–1.2)
Total Protein: 7.1 g/dL (ref 6.0–8.3)

## 2017-12-03 LAB — LIPID PANEL
CHOL/HDL RATIO: 3
Cholesterol: 273 mg/dL — ABNORMAL HIGH (ref 0–200)
HDL: 94.9 mg/dL (ref >39.00)
LDL Cholesterol: 164 mg/dL — ABNORMAL HIGH (ref 0–99)
NONHDL: 178.49
Triglycerides: 72 mg/dL (ref 0.0–149.0)
VLDL: 14.4 mg/dL (ref 0.0–40.0)

## 2017-12-03 LAB — CBC WITH DIFFERENTIAL/PLATELET
Basophils Absolute: 0 10*3/uL (ref 0.0–0.1)
Basophils Relative: 1.2 % (ref 0.0–3.0)
EOS PCT: 1.1 % (ref 0.0–5.0)
Eosinophils Absolute: 0 10*3/uL (ref 0.0–0.7)
HEMATOCRIT: 41.7 % (ref 36.0–46.0)
HEMOGLOBIN: 14 g/dL (ref 12.0–15.0)
LYMPHS PCT: 35.3 % (ref 12.0–46.0)
Lymphs Abs: 1.2 10*3/uL (ref 0.7–4.0)
MCHC: 33.7 g/dL (ref 30.0–36.0)
MCV: 96.8 fl (ref 78.0–100.0)
Monocytes Absolute: 0.4 10*3/uL (ref 0.1–1.0)
Monocytes Relative: 12.5 % — ABNORMAL HIGH (ref 3.0–12.0)
Neutro Abs: 1.7 10*3/uL (ref 1.4–7.7)
Neutrophils Relative %: 49.9 % (ref 43.0–77.0)
Platelets: 258 10*3/uL (ref 150.0–400.0)
RBC: 4.31 Mil/uL (ref 3.87–5.11)
RDW: 13.2 % (ref 11.5–15.5)
WBC: 3.5 10*3/uL — AB (ref 4.0–10.5)

## 2017-12-03 LAB — TSH: TSH: 3.72 u[IU]/mL (ref 0.35–4.50)

## 2017-12-03 MED ORDER — ALENDRONATE SODIUM 70 MG PO TABS: 70.0000 mg | ORAL_TABLET | ORAL | 3 refills | Status: DC

## 2017-12-03 MED ORDER — ESTROGENS, CONJUGATED 0.625 MG/GM VA CREA: TOPICAL_CREAM | VAGINAL | 2 refills | Status: DC

## 2017-12-03 NOTE — Patient Instructions (Signed)
Preventive Care 40-64 Years, Female Preventive care refers to lifestyle choices and visits with your health care provider that can promote health and wellness. What does preventive care include?  A yearly physical exam. This is also called an annual well check.  Dental exams once or twice a year.  Routine eye exams. Ask your health care provider how often you should have your eyes checked.  Personal lifestyle choices, including: ? Daily care of your teeth and gums. ? Regular physical activity. ? Eating a healthy diet. ? Avoiding tobacco and drug use. ? Limiting alcohol use. ? Practicing safe sex. ? Taking low-dose aspirin daily starting at age 58. ? Taking vitamin and mineral supplements as recommended by your health care provider. What happens during an annual well check? The services and screenings done by your health care provider during your annual well check will depend on your age, overall health, lifestyle risk factors, and family history of disease. Counseling Your health care provider may ask you questions about your:  Alcohol use.  Tobacco use.  Drug use.  Emotional well-being.  Home and relationship well-being.  Sexual activity.  Eating habits.  Work and work Statistician.  Method of birth control.  Menstrual cycle.  Pregnancy history.  Screening You may have the following tests or measurements:  Height, weight, and BMI.  Blood pressure.  Lipid and cholesterol levels. These may be checked every 5 years, or more frequently if you are over 81 years old.  Skin check.  Lung cancer screening. You may have this screening every year starting at age 78 if you have a 30-pack-year history of smoking and currently smoke or have quit within the past 15 years.  Fecal occult blood test (FOBT) of the stool. You may have this test every year starting at age 65.  Flexible sigmoidoscopy or colonoscopy. You may have a sigmoidoscopy every 5 years or a colonoscopy  every 10 years starting at age 30.  Hepatitis C blood test.  Hepatitis B blood test.  Sexually transmitted disease (STD) testing.  Diabetes screening. This is done by checking your blood sugar (glucose) after you have not eaten for a while (fasting). You may have this done every 1-3 years.  Mammogram. This may be done every 1-2 years. Talk to your health care provider about when you should start having regular mammograms. This may depend on whether you have a family history of breast cancer.  BRCA-related cancer screening. This may be done if you have a family history of breast, ovarian, tubal, or peritoneal cancers.  Pelvic exam and Pap test. This may be done every 3 years starting at age 80. Starting at age 36, this may be done every 5 years if you have a Pap test in combination with an HPV test.  Bone density scan. This is done to screen for osteoporosis. You may have this scan if you are at high risk for osteoporosis.  Discuss your test results, treatment options, and if necessary, the need for more tests with your health care provider. Vaccines Your health care provider may recommend certain vaccines, such as:  Influenza vaccine. This is recommended every year.  Tetanus, diphtheria, and acellular pertussis (Tdap, Td) vaccine. You may need a Td booster every 10 years.  Varicella vaccine. You may need this if you have not been vaccinated.  Zoster vaccine. You may need this after age 5.  Measles, mumps, and rubella (MMR) vaccine. You may need at least one dose of MMR if you were born in  1957 or later. You may also need a second dose.  Pneumococcal 13-valent conjugate (PCV13) vaccine. You may need this if you have certain conditions and were not previously vaccinated.  Pneumococcal polysaccharide (PPSV23) vaccine. You may need one or two doses if you smoke cigarettes or if you have certain conditions.  Meningococcal vaccine. You may need this if you have certain  conditions.  Hepatitis A vaccine. You may need this if you have certain conditions or if you travel or work in places where you may be exposed to hepatitis A.  Hepatitis B vaccine. You may need this if you have certain conditions or if you travel or work in places where you may be exposed to hepatitis B.  Haemophilus influenzae type b (Hib) vaccine. You may need this if you have certain conditions.  Talk to your health care provider about which screenings and vaccines you need and how often you need them. This information is not intended to replace advice given to you by your health care provider. Make sure you discuss any questions you have with your health care provider. Document Released: 10/29/2015 Document Revised: 06/21/2016 Document Reviewed: 08/03/2015 Elsevier Interactive Patient Education  2018 [employer]  

## 2017-12-03 NOTE — Progress Notes (Signed)
Subjective:   I acted as a Education administrator for Dr. Carollee Herter.  Guerry Bruin, CMA   Kaitlyn Perez is a 62 y.o. female and is here for a comprehensive physical exam. The patient reports no problems.  Social History   Socioeconomic History  . Marital status: Married    Spouse name: Anhelica Fowers  . Number of children: Not on file  . Years of education: 1  . Highest education level: Not on file  Social Needs  . Financial resource strain: Not on file  . Food insecurity - worry: Not on file  . Food insecurity - inability: Not on file  . Transportation needs - medical: Not on file  . Transportation needs - non-medical: Not on file  Occupational History  . Occupation: IT sales professional: [employer]  Tobacco Use  . Smoking status: Never Smoker  . Smokeless tobacco: Never Used  Substance and Sexual Activity  . Alcohol use: Yes    Alcohol/week: 0.0 oz    Comment: rare--1 x a month  . Drug use: No  . Sexual activity: Yes    Partners: Male  Other Topics Concern  . Not on file  Social History Narrative   Exercise--yes, at least 4 days a week   Health Maintenance  Topic Date Due  . COLONOSCOPY  05/29/2017  . HIV Screening  12/04/2023 (Originally 06/11/1971)  . MAMMOGRAM  01/17/2018  . PAP SMEAR  01/10/2019  . DEXA SCAN  01/18/2019  . TETANUS/TDAP  01/10/2023  . INFLUENZA VACCINE  Completed  . Hepatitis C Screening  Completed    The following portions of the patient's history were reviewed and updated as appropriate:  She  has a past medical history of Cancer ([place]) (2006). She does not have any pertinent problems on file. She  has a past surgical history that includes Nasal sinus surgery (2001); Tonsillectomy and adenoidectomy (1967); and Mandible surgery (Bilateral, 08/2012). Her family history includes Arthritis in her mother; Breast cancer in her maternal aunt and unknown relative; Cancer in her brother; Cancer (age of onset: 11) in her mother; Colon cancer in her paternal uncle;  Diabetes in her father; Heart disease (age of onset: 29) in her maternal grandfather; Heart disease (age of onset: 20) in her maternal grandmother; Hyperlipidemia in her father and mother; Hypertension in her father; Hypotension in her mother; Lung cancer in her father and paternal uncle; Stroke in her father; Uterine cancer in her paternal aunt. She  reports that  has never smoked. she has never used smokeless tobacco. She reports that she drinks alcohol. She reports that she does not use drugs. She has a current medication list which includes the following prescription(s): alendronate, biotin, conjugated estrogens, multiple vitamins-minerals, multiple vitamins-minerals, omega-3 fatty acids, and probiotic product. Current Outpatient Medications on File Prior to Visit  Medication Sig Dispense Refill  . Biotin 10000 MCG TABS Take 1 tablet by mouth daily.    . Multiple Vitamins-Minerals (CENTRUM PO) Take 1 tablet by mouth daily.    . Multiple Vitamins-Minerals (PRESERVISION AREDS 2 PO) Take 1 tablet by mouth daily.    . Omega-3 Fatty Acids (FISH OIL PO) Take 2,000 mg by mouth daily.    . Probiotic Product (PRO-BIOTIC BLEND PO) Take by mouth.     No current facility-administered medications on file prior to visit.    She is allergic to neosporin [neomycin-polymyxin-gramicidin]..  Review of Systems Review of Systems  Constitutional: Negative for activity change, appetite change and fatigue.  HENT:  Negative for hearing loss, congestion, tinnitus and ear discharge.  dentist q64m Eyes: Negative for visual disturbance (see optho q1y -- vision corrected to 20/20 with glasses).  Respiratory: Negative for cough, chest tightness and shortness of breath.   Cardiovascular: Negative for chest pain, palpitations and leg swelling.  Gastrointestinal: Negative for abdominal pain, diarrhea, constipation and abdominal distention.  Genitourinary: Negative for urgency, frequency, decreased urine volume and difficulty  urinating.  Musculoskeletal: Negative for back pain, arthralgias and gait problem.  Skin: Negative for color change, pallor and rash.  Neurological: Negative for dizziness, light-headedness, numbness and headaches.  Hematological: Negative for adenopathy. Does not bruise/bleed easily.  Psychiatric/Behavioral: Negative for suicidal ideas, confusion, sleep disturbance, self-injury, dysphoric mood, decreased concentration and agitation.       Objective:    BP 118/76 (BP Location: Left Arm, Cuff Size: Normal)   Pulse (!) 51   Temp 98 F (36.7 C) (Oral)   Resp 16   Ht 5\' 4"  (1.626 m)   Wt 135 lb 3.2 oz (61.3 kg)   SpO2 96%   BMI 23.21 kg/m  General appearance: alert, cooperative, appears stated age and no distress Head: Normocephalic, without obvious abnormality, atraumatic Eyes: conjunctivae/corneas clear. PERRL, EOM's intact. Fundi benign. Ears: normal TM's and external ear canals both ears Nose: Nares normal. Septum midline. Mucosa normal. No drainage or sinus tenderness. Throat: lips, mucosa, and tongue normal; teeth and gums normal Neck: no adenopathy, no carotid bruit, no JVD, supple, symmetrical, trachea midline and thyroid not enlarged, symmetric, no tenderness/mass/nodules Back: symmetric, no curvature. ROM normal. No CVA tenderness. Lungs: clear to auscultation bilaterally Breasts: normal appearance, no masses or tenderness Heart: regular rate and rhythm, S1, S2 normal, no murmur, click, rub or gallop Abdomen: soft, non-tender; bowel sounds normal; no masses,  no organomegaly Pelvic: deferred Extremities: extremities normal, atraumatic, no cyanosis or edema Pulses: 2+ and symmetric Skin: Skin color, texture, turgor normal. No rashes or lesions Lymph nodes: Cervical, supraclavicular, and axillary nodes normal. Neurologic: Alert and oriented X 3, normal strength and tone. Normal symmetric reflexes. Normal coordination and gait    Assessment:    Healthy female exam.       Plan:     ghm utd Check labs See After Visit Summary for Counseling Recommendations    1. Estrogen deficiency  - conjugated estrogens (PREMARIN) vaginal cream; INSERT 7.16 (1/4) APPLICATORFUL VAGINALLY TWICE A WEEK.  Dispense: 30 g; Refill: 2  2. Preventative health care See above - Comprehensive metabolic panel - CBC with Differential/Platelet - Lipid panel - TSH  3. Osteopenia, unspecified location   - alendronate (FOSAMAX) 70 MG tablet; Take 1 tablet (70 mg total) by mouth every 7 (seven) days. Take with a full glass of water on an empty stomach.  Dispense: 12 tablet; Refill: 3  4. History of colon polyps   - Ambulatory referral to Gastroenterology

## 2017-12-05 ENCOUNTER — Telehealth: Payer: Self-pay | Admitting: Family Medicine

## 2017-12-05 NOTE — Telephone Encounter (Signed)
Pt given results per notes of Dr. Carollee Herter on 12/04/17, patient verbalized understanding. She will call back to schedule 3 month lab appointment. Unable to document in result note due to result note not routed to [facility]

## 2017-12-10 ENCOUNTER — Other Ambulatory Visit: Payer: Self-pay | Admitting: *Deleted

## 2017-12-10 DIAGNOSIS — E785 Hyperlipidemia, unspecified: Secondary | ICD-10-CM

## 2018-01-23 LAB — HM MAMMOGRAPHY

## 2018-02-08 ENCOUNTER — Encounter: Payer: Self-pay | Admitting: Family Medicine

## 2018-03-12 ENCOUNTER — Encounter: Payer: Self-pay | Admitting: Family Medicine

## 2018-04-11 ENCOUNTER — Telehealth: Payer: Self-pay | Admitting: Gastroenterology

## 2018-04-11 NOTE — Telephone Encounter (Signed)
Reviewed records colonoscopy August 2013 by Dr. Collene Mares with removal of tubular adenoma due for surveillance colonoscopy.  Okay to schedule direct procedure.

## 2018-04-11 NOTE — Telephone Encounter (Signed)
Received Colon/Path report from [facility]. Patient says that she is due for another colonoscopy and is requesting to see Dr. Silverio Decamp. Records placed on her desk for review.

## 2018-04-12 ENCOUNTER — Encounter: Payer: Self-pay | Admitting: Gastroenterology

## 2018-06-05 ENCOUNTER — Ambulatory Visit (AMBULATORY_SURGERY_CENTER): Payer: Self-pay | Admitting: *Deleted

## 2018-06-05 ENCOUNTER — Encounter: Payer: Self-pay | Admitting: Gastroenterology

## 2018-06-05 VITALS — Ht 64.0 in | Wt 139.6 lb

## 2018-06-05 DIAGNOSIS — Z86010 Personal history of colonic polyps: Secondary | ICD-10-CM

## 2018-06-05 MED ORDER — NA SULFATE-K SULFATE-MG SULF 17.5-3.13-1.6 GM/177ML PO SOLN
1.0000 | Freq: Once | ORAL | 0 refills | Status: AC
Start: 2018-06-05 — End: 2018-06-05

## 2018-06-05 NOTE — Progress Notes (Signed)
No egg or soy allergy known to patient  No issues with past sedation with any surgeries  or procedures, no intubation problems - pt states small airway but never had any issues with intubation  No diet pills per patient No home 02 use per patient  No blood thinners per patient  Pt denies issues with constipation - very occ constipation, not even every month - uses probiotic daily  No A fib or A flutter  EMMI video sent to pt's e mail --  Pt has 2 caps on front teeth  $15 Suprep coupon to pt in [place] today

## 2018-06-19 ENCOUNTER — Ambulatory Visit (AMBULATORY_SURGERY_CENTER): Payer: BC Managed Care – PPO | Admitting: Gastroenterology

## 2018-06-19 ENCOUNTER — Encounter: Payer: Self-pay | Admitting: Gastroenterology

## 2018-06-19 DIAGNOSIS — K6389 Other specified diseases of intestine: Secondary | ICD-10-CM

## 2018-06-19 DIAGNOSIS — D123 Benign neoplasm of transverse colon: Secondary | ICD-10-CM

## 2018-06-19 DIAGNOSIS — Z86010 Personal history of colonic polyps: Secondary | ICD-10-CM | POA: Diagnosis not present

## 2018-06-19 MED ORDER — SODIUM CHLORIDE 0.9 % IV SOLN: 500.0000 mL | Freq: Once | INTRAVENOUS | Status: AC

## 2018-06-19 NOTE — Patient Instructions (Signed)
YOU HAD AN ENDOSCOPIC PROCEDURE TODAY AT THE [facility]:   Refer to the procedure report that was given to you for any specific questions about what was found during the examination.  If the procedure report does not answer your questions, please call your gastroenterologist to clarify.  If you requested that your care partner not be given the details of your procedure findings, then the procedure report has been included in a sealed envelope for you to review at your convenience later.  YOU SHOULD EXPECT: Some feelings of bloating in the abdomen. Passage of more gas than usual.  Walking can help get rid of the air that was put into your GI tract during the procedure and reduce the bloating. If you had a lower endoscopy (such as a colonoscopy or flexible sigmoidoscopy) you may notice spotting of blood in your stool or on the toilet paper. If you underwent a bowel prep for your procedure, you may not have a normal bowel movement for a few days.  Please Note:  You might notice some irritation and congestion in your nose or some drainage.  This is from the oxygen used during your procedure.  There is no need for concern and it should clear up in a day or so.  SYMPTOMS TO REPORT IMMEDIATELY:   Following lower endoscopy (colonoscopy or flexible sigmoidoscopy):  Excessive amounts of blood in the stool  Significant tenderness or worsening of abdominal pains  Swelling of the abdomen that is new, acute  Fever of 100F or higher  For urgent or emergent issues, a gastroenterologist can be reached at any hour by calling [phone].   DIET:  We do recommend a small meal at first, but then you may proceed to your regular diet.  Drink plenty of fluids but you should avoid alcoholic beverages for 24 hours.  ACTIVITY:  You should plan to take it easy for the rest of today and you should NOT DRIVE or use heavy machinery until tomorrow (because of the sedation medicines used during the test).     FOLLOW UP: Our staff will call the number listed on your records the next business day following your procedure to check on you and address any questions or concerns that you may have regarding the information given to you following your procedure. If we do not reach you, we will leave a message.  However, if you are feeling well and you are not experiencing any problems, there is no need to return our call.  We will assume that you have returned to your regular daily activities without incident.  If any biopsies were taken you will be contacted by phone or by letter within the next 1-3 weeks.  Please call us at [phone] if you have not heard about the biopsies in 3 weeks.   Await for biopsy results Polyps (handout given) Diverticulosis (handout given)  SIGNATURES/CONFIDENTIALITY: You and/or your care partner have signed paperwork which will be entered into your electronic medical record.  These signatures attest to the fact that that the information above on your After Visit Summary has been reviewed and is understood.  Full responsibility of the confidentiality of this discharge information lies with you and/or your care-partner. 

## 2018-06-19 NOTE — Progress Notes (Signed)
Pt's states no medical or surgical changes since previsit or office visit. 

## 2018-06-19 NOTE — Progress Notes (Signed)
A and O x3. Report to RN. Tolerated MAC anesthesia well.

## 2018-06-19 NOTE — Progress Notes (Signed)
Called to room to assist during endoscopic procedure.  Patient ID and intended procedure confirmed with present staff. Received instructions for my participation in the procedure from the performing physician.  

## 2018-06-19 NOTE — Op Note (Signed)
[facility] Patient Name: Kaitlyn Perez Procedure Date: 06/19/2018 8:03 AM MRN: [id number] Endoscopist: Mauri Pole , MD Age: 62 Referring MD:  Date of Birth: 08/21/1956 Gender: Female Account #: [id number] Procedure:                Colonoscopy Indications:              Surveillance: Personal history of adenomatous                            polyps on last colonoscopy > 5 years ago, High risk                            colon cancer surveillance: Personal history of                            adenoma less than 10 mm in size Medicines:                Monitored Anesthesia Care Procedure:                Pre-Anesthesia Assessment:                           - Prior to the procedure, a History and Physical                            was performed, and patient medications and                            allergies were reviewed. The patient's tolerance of                            previous anesthesia was also reviewed. The risks                            and benefits of the procedure and the sedation                            options and risks were discussed with the patient.                            All questions were answered, and informed consent                            was obtained. Prior Anticoagulants: The patient has                            taken no previous anticoagulant or antiplatelet                            agents. ASA Grade Assessment: II - A patient with                            mild systemic disease. After reviewing the risks  and benefits, the patient was deemed in                            satisfactory condition to undergo the procedure.                           After obtaining informed consent, the colonoscope                            was passed under direct vision. Throughout the                            procedure, the patient's blood pressure, pulse, and                            oxygen saturations were monitored  continuously. The                            Model PCF-H190DL [id number]) scope was introduced                            through the anus and advanced to the the terminal                            ileum, with identification of the appendiceal                            orifice and IC valve. The colonoscopy was performed                            without difficulty. The patient tolerated the                            procedure well. The quality of the bowel                            preparation was excellent. The terminal ileum,                            ileocecal valve, appendiceal orifice, and rectum                            were photographed. Scope In: 8:08:03 AM Scope Out: 8:24:45 AM Scope Withdrawal Time: 0 hours 10 minutes 5 seconds  Total Procedure Duration: 0 hours 16 minutes 42 seconds  Findings:                 The perianal and digital rectal examinations were                            normal.                           A 2 mm polyp was found in the transverse colon. The  polyp was sessile. The polyp was removed with a                            cold biopsy forceps. Resection and retrieval were                            complete.                           Multiple small and large-mouthed diverticula were                            found in the sigmoid colon and descending colon.                            There was narrowing of the colon in association                            with the diverticular opening. There was evidence                            of an impacted diverticulum.                           Non-bleeding internal hemorrhoids were found during                            retroflexion. The hemorrhoids were medium-sized. Complications:            No immediate complications. Estimated Blood Loss:     Estimated blood loss was minimal. Impression:               - One 2 mm polyp in the transverse colon, removed                             with a cold biopsy forceps. Resected and retrieved.                           - Moderate diverticulosis in the sigmoid colon and                            in the descending colon. There was narrowing of the                            colon in association with the diverticular opening.                            There was evidence of an impacted diverticulum.                           - Non-bleeding internal hemorrhoids. Recommendation:           - Patient has a contact number available for  emergencies. The signs and symptoms of potential                            delayed complications were discussed with the                            patient. Return to normal activities tomorrow.                            Written discharge instructions were provided to the                            patient.                           - Resume previous diet.                           - Continue present medications.                           - Await pathology results.                           - Repeat colonoscopy in 5-10 years for surveillance                            based on pathology results. Mauri Pole, MD 06/19/2018 8:29:27 AM This report has been signed electronically.

## 2018-06-20 ENCOUNTER — Telehealth: Payer: Self-pay | Admitting: *Deleted

## 2018-06-20 NOTE — Telephone Encounter (Signed)
  Follow up Call-  Call back number 06/19/2018  Post procedure Call Back phone  # [phone]  Permission to leave phone message Yes  Some recent data might be hidden     Patient questions:  Do you have a fever, pain , or abdominal swelling? No. Pain Score  0 *  Have you tolerated food without any problems? Yes  Have you been able to return to your normal activities? Yes.    Do you have any questions about your discharge instructions: Diet   No. Medications  No. Follow up visit  No.  Do you have questions or concerns about your Care? No.  Actions: * If pain score is 4 or above: No action needed, pain <4.

## 2018-07-01 ENCOUNTER — Encounter: Payer: Self-pay | Admitting: Gastroenterology

## 2018-07-10 ENCOUNTER — Ambulatory Visit (HOSPITAL_BASED_OUTPATIENT_CLINIC_OR_DEPARTMENT_OTHER)
Admission: RE | Admit: 2018-07-10 | Discharge: 2018-07-10 | Disposition: A | Discharge status: Home / Self Care | Payer: BC Managed Care – PPO | Source: Ambulatory Visit | Attending: Medical | Admitting: Medical

## 2018-07-10 ENCOUNTER — Ambulatory Visit: Payer: BC Managed Care – PPO | Admitting: Medical

## 2018-07-10 ENCOUNTER — Encounter: Payer: Self-pay | Admitting: Medical

## 2018-07-10 VITALS — BP 140/76 | HR 48 | Temp 98.1°F | Resp 16 | Ht 64.0 in | Wt 142.2 lb

## 2018-07-10 DIAGNOSIS — M79671 Pain in right foot: Secondary | ICD-10-CM | POA: Diagnosis not present

## 2018-07-10 DIAGNOSIS — M2011 Hallux valgus (acquired), right foot: Secondary | ICD-10-CM | POA: Diagnosis not present

## 2018-07-10 DIAGNOSIS — M7731 Calcaneal spur, right foot: Secondary | ICD-10-CM | POA: Insufficient documentation

## 2018-07-10 MED ORDER — MELOXICAM 7.5 MG PO TABS: ORAL_TABLET | ORAL | 0 refills | Status: DC

## 2018-07-10 NOTE — Progress Notes (Signed)
Subjective:    Patient ID: Kaitlyn Perez, female    DOB: November 13, 1955, 62 y.o.   MRN: [id number]  HPI  Pt in with some rt foot pain since Monday. Pt states pain came on gradually. She states first felt pain in morning. She states pain at first in described base of 4th and 5th toe area. Pain increased since Monday night. Some swelling. She changes her gate and now some pain up her foot into her ankle. No fall or trauma. No known insect bite. No redness or warmth.   Pt was doing routine work out. Nothing real strenuous. She took it easy due to having the pain before working out.  Pt did try advil and did not help the pain.  Pt is supposed to travel to [place] upcoming.    Review of Systems  Constitutional: Negative for chills, fatigue and fever.  Respiratory: Negative for cough, chest tightness, shortness of breath and wheezing.   Gastrointestinal: Negative for abdominal pain.  Musculoskeletal:       Rt foot pain  Skin: Negative for rash.  Neurological: Negative for dizziness and headaches.  Hematological: Negative for adenopathy. Does not bruise/bleed easily.  Psychiatric/Behavioral: Negative for behavioral problems and confusion.   Past Medical History:  Diagnosis Date  . Allergy   . Cancer [facility]) 2006   BCCleft upper side   . Cataract    are beginning   . Hyperlipidemia   . Osteopenia      Social History   Socioeconomic History  . Marital status: Married    Spouse name: Inari Shin  . Number of children: Not on file  . Years of education: 52  . Highest education level: Not on file  Occupational History  . Occupation: Advertising account planner VP    Employer: [employer]  . Financial resource strain: Not on file  . Food insecurity:    Worry: Not on file    Inability: Not on file  . Transportation needs:    Medical: Not on file    Non-medical: Not on file  Tobacco Use  . Smoking status: Never Smoker  . Smokeless tobacco: Never Used  Substance and Sexual Activity   . Alcohol use: Yes    Comment: rare--1 x a month  . Drug use: No  . Sexual activity: Yes    Partners: Male  Lifestyle  . Physical activity:    Days per week: Not on file    Minutes per session: Not on file  . Stress: Not on file  Relationships  . Social connections:    Talks on phone: Not on file    Gets together: Not on file    Attends religious service: Not on file    Active member of club or organization: Not on file    Attends meetings of clubs or organizations: Not on file    Relationship status: Not on file  . Intimate partner violence:    Fear of current or ex partner: Not on file    Emotionally abused: Not on file    Physically abused: Not on file    Forced sexual activity: Not on file  Other Topics Concern  . Not on file  Social History Narrative   Exercise--yes, at least 4 days a week    Past Surgical History:  Procedure Laterality Date  . COLONOSCOPY    . MANDIBLE SURGERY Bilateral 08/2012  . NASAL SINUS SURGERY  2001  . POLYPECTOMY    . TONSILLECTOMY AND  ADENOIDECTOMY  1967    Family History  Problem Relation Age of Onset  . Arthritis Mother   . Hyperlipidemia Mother   . Hypotension Mother   . Cancer Mother 57       skin--melanoma  . Colon cancer Paternal Uncle   . Hyperlipidemia Father   . Hypertension Father   . Diabetes Father   . Stroke Father   . Lung cancer Father   . Colon polyps Father   . Heart disease Maternal Grandfather 74       Aortic aneurysm  . Heart disease Maternal Grandmother 61       Multiple heart attacks  . Cancer Brother        melanoma  . Uterine cancer Paternal Aunt   . Breast cancer Maternal Aunt   . Breast cancer Unknown        maternal female cousin  . Lung cancer Paternal Uncle        x's 2 smokers  . Esophageal cancer Neg Hx   . Rectal cancer Neg Hx   . Stomach cancer Neg Hx     Allergies  Allergen Reactions  . Neosporin [Neomycin-Polymyxin-Gramicidin] Rash    Local rash    Current Outpatient  Medications on File Prior to Visit  Medication Sig Dispense Refill  . alendronate (FOSAMAX) 70 MG tablet Take 1 tablet (70 mg total) by mouth every 7 (seven) days. Take with a full glass of water on an empty stomach. 12 tablet 3  . Biotin 10000 MCG TABS Take 1 tablet by mouth daily.    Marland Kitchen conjugated estrogens (PREMARIN) vaginal cream INSERT 0.09 (1/4) APPLICATORFUL VAGINALLY TWICE A WEEK. 30 g 2  . Multiple Vitamins-Minerals (CENTRUM PO) Take 1 tablet by mouth daily.    . Multiple Vitamins-Minerals (PRESERVISION AREDS 2 PO) Take 1 tablet by mouth daily.    . Omega-3 Fatty Acids (FISH OIL PO) Take 2,000 mg by mouth daily.    . Probiotic Product (PRO-BIOTIC BLEND PO) Take by mouth.     Current Facility-Administered Medications on File Prior to Visit  Medication Dose Route Frequency Provider Last Rate Last Dose  . 0.9 %  sodium chloride infusion  500 mL Intravenous Once Nandigam, Kavitha V, MD        BP 140/76   Pulse (!) 48   Temp 98.1 F (36.7 C) (Oral)   Resp 16   Ht 5\' 4"  (1.626 m)   Wt 142 lb 3.2 oz (64.5 kg)   SpO2 100%   BMI 24.41 kg/m       Objective:   Physical Exam  General- No acute distress. Pleasant patient. Lungs- Clear, even and unlabored. Heart- regular rate and rhythm. Neurologic- CNII- XII grossly intact.  Rt lower ext- no calf swelling.  Rt ankle- no pain on palpation achilles tendon. Rt foot- no redness, no swelling or warmth. Bottom aspect mid 4th and 5th metatarsal direct tenderness.            Assessment & Plan:  [phone]  For your recent right foot pain, I do want to get x-ray of the foot today and make sure no small stress fracture in fourth or fifth metatarsal region.  If x-ray negative pain may be from a soft tissue/plantar fascia type pain.  Will prescribe meloxicam to use 1 to 2 tablets a day for pain.  Not to  use any over-the-counter NSAIDs while on meloxicam.  Providing you with 2 inch Ace wrap to apply as directed provided no  stress fracture or abnormality seen on x-ray.  If stress fracture seen then we will try to get you in with sports medicine for shoe or  boot placement.  If no fracture seen but pain persist by the time you come back from [place] then would try to get you in with sports medicine or podiatrist.  Mackie Pai, PA-C

## 2018-07-10 NOTE — Patient Instructions (Addendum)
For your recent right foot pain, I do want to get x-ray of the foot today and make sure no small stress fracture in fourth or fifth metatarsal region.  If x-ray negative pain may be from a soft tissue/plantar fascia type pain.  Will prescribe meloxicam to use 1 to 2 tablets a day for pain.  Not to  use any over-the-counter NSAIDs while on meloxicam.  Providing you with 2 inch Ace wrap to apply as directed provided no stress fracture or abnormality seen on x-ray.  If stress fracture seen then we will try to get you in with sports medicine for shoe or  boot placement.  If no fracture seen but pain persist by the time you come back from [place] then would try to get you in with sports medicine or podiatrist.

## 2018-08-03 ENCOUNTER — Encounter: Payer: Self-pay | Admitting: Medical

## 2018-08-05 ENCOUNTER — Encounter: Payer: Self-pay | Admitting: Family Medicine

## 2018-08-06 ENCOUNTER — Encounter: Payer: Self-pay | Admitting: Podiatry

## 2018-08-06 NOTE — Telephone Encounter (Signed)
She can get pneum 23 and shingrix as well if she like

## 2018-08-10 ENCOUNTER — Ambulatory Visit (INDEPENDENT_AMBULATORY_CARE_PROVIDER_SITE_OTHER): Payer: BC Managed Care – PPO

## 2018-08-10 ENCOUNTER — Ambulatory Visit: Payer: BC Managed Care – PPO | Admitting: Podiatry

## 2018-08-10 ENCOUNTER — Encounter: Payer: Self-pay | Admitting: Podiatry

## 2018-08-10 VITALS — BP 138/79 | HR 52

## 2018-08-10 DIAGNOSIS — M79671 Pain in right foot: Secondary | ICD-10-CM | POA: Diagnosis not present

## 2018-08-10 DIAGNOSIS — R269 Unspecified abnormalities of gait and mobility: Secondary | ICD-10-CM

## 2018-08-10 DIAGNOSIS — M84374A Stress fracture, right foot, initial encounter for fracture: Secondary | ICD-10-CM

## 2018-08-10 DIAGNOSIS — M8430XA Stress fracture, unspecified site, initial encounter for fracture: Secondary | ICD-10-CM

## 2018-08-10 NOTE — Progress Notes (Signed)
Subjective:  Patient ID: Kaitlyn Perez, female    DOB: June 26, 1956,  MRN: [id number]  Chief Complaint  Patient presents with  . Fracture    right foot possible stress facture    62 y.o. female presents with the above complaint. 9/22 was carrying a lot of stuff for 12 hours walking on uneven ground.  Pain in the outside o the right foot. Sore the next day. 2 days after when working with her trainer had a lot of pain and couldn't walk. Saw PA at PCP office and was told it could be a possible stress fracture.   Not as painful now. Pain comes and goes. Endorses swelling. Aching in nature.   Review of Systems: Negative except as noted in the HPI. Denies N/V/F/Ch.  Past Medical History:  Diagnosis Date  . Allergy   . Cancer [facility]) 2006   BCCleft upper side   . Cataract    are beginning   . Hyperlipidemia   . Osteopenia     Current Outpatient Medications:  .  alendronate (FOSAMAX) 70 MG tablet, Take 1 tablet (70 mg total) by mouth every 7 (seven) days. Take with a full glass of water on an empty stomach., Disp: 12 tablet, Rfl: 3 .  Biotin 10000 MCG TABS, Take 1 tablet by mouth daily., Disp: , Rfl:  .  conjugated estrogens (PREMARIN) vaginal cream, INSERT 0.53 (1/4) APPLICATORFUL VAGINALLY TWICE A WEEK., Disp: 30 g, Rfl: 2 .  meloxicam (MOBIC) 15 MG tablet, , Disp: , Rfl:  .  meloxicam (MOBIC) 7.5 MG tablet, 1-2 tab a day, Disp: 30 tablet, Rfl: 0 .  Multiple Vitamins-Minerals (CENTRUM PO), Take 1 tablet by mouth daily., Disp: , Rfl:  .  Multiple Vitamins-Minerals (PRESERVISION AREDS 2 PO), Take 1 tablet by mouth daily., Disp: , Rfl:  .  Omega-3 Fatty Acids (FISH OIL PO), Take 2,000 mg by mouth daily., Disp: , Rfl:  .  Probiotic Product (PRO-BIOTIC BLEND PO), Take by mouth., Disp: , Rfl:   Current Facility-Administered Medications:  .  0.9 %  sodium chloride infusion, 500 mL, Intravenous, Once, Nandigam, Venia Minks, MD  Social History   Tobacco Use  Smoking Status Never Smoker    Smokeless Tobacco Never Used    Allergies  Allergen Reactions  . Neosporin [Neomycin-Polymyxin-Gramicidin] Rash    Local rash   Objective:   Vitals:   08/10/18 0838  BP: 138/79  Pulse: (!) 52   There is no height or weight on file to calculate BMI. Constitutional Well developed. Well nourished.  Vascular Dorsalis pedis pulses palpable bilaterally. Posterior tibial pulses palpable bilaterally. Capillary refill normal to all digits.  No cyanosis or clubbing noted. Pedal hair growth normal.  Neurologic Normal speech. Oriented to person, place, and time. Epicritic sensation to light touch grossly present bilaterally.  Dermatologic Nails well groomed and normal in appearance. No open wounds. No skin lesions.  Orthopedic: Normal joint ROM without pain or crepitus bilaterally. No visible deformities. POP R 4th metatarsal. No pain 3rd interspace R.   Radiographs: Taken and reviewed. Interval Stress fracur Assessment:   1. Stress fracture, right foot, initial encounter for fracture   2. Stress reaction of bone   3. Gait disturbance   4. Acute pain of right foot    Plan:  Patient was evaluated and treated and all questions answered.  Stress Fracture R Foot -Prior XR Reviwed. -Repeat XR today showing interval stress fx. -Would benefit from conservative management of this fracture. -Will immobilize in CAM boot. -  Educated on reduction of activity. -Pharmacologic management: Tylenol, ibuprofen, does not need to take Meloxicam as it did not do anything for her.  Return in about 6 weeks (around 09/21/2018) for Stress Fracture. New XR at that time.

## 2018-08-21 ENCOUNTER — Ambulatory Visit (INDEPENDENT_AMBULATORY_CARE_PROVIDER_SITE_OTHER): Payer: BC Managed Care – PPO

## 2018-08-21 DIAGNOSIS — Z23 Encounter for immunization: Secondary | ICD-10-CM

## 2018-08-27 ENCOUNTER — Ambulatory Visit: Payer: BC Managed Care – PPO | Admitting: Family Medicine

## 2018-09-19 ENCOUNTER — Ambulatory Visit (INDEPENDENT_AMBULATORY_CARE_PROVIDER_SITE_OTHER): Payer: BC Managed Care – PPO

## 2018-09-19 ENCOUNTER — Ambulatory Visit (INDEPENDENT_AMBULATORY_CARE_PROVIDER_SITE_OTHER): Payer: BC Managed Care – PPO | Admitting: Podiatry

## 2018-09-19 ENCOUNTER — Encounter: Payer: Self-pay | Admitting: Podiatry

## 2018-09-19 DIAGNOSIS — M84374A Stress fracture, right foot, initial encounter for fracture: Secondary | ICD-10-CM

## 2018-09-19 DIAGNOSIS — Z9889 Other specified postprocedural states: Secondary | ICD-10-CM

## 2018-10-23 ENCOUNTER — Ambulatory Visit (INDEPENDENT_AMBULATORY_CARE_PROVIDER_SITE_OTHER): Payer: BC Managed Care – PPO

## 2018-10-23 DIAGNOSIS — Z23 Encounter for immunization: Secondary | ICD-10-CM

## 2018-10-24 ENCOUNTER — Ambulatory Visit (INDEPENDENT_AMBULATORY_CARE_PROVIDER_SITE_OTHER): Payer: Self-pay | Admitting: Podiatry

## 2018-10-24 ENCOUNTER — Ambulatory Visit (INDEPENDENT_AMBULATORY_CARE_PROVIDER_SITE_OTHER): Payer: BC Managed Care – PPO

## 2018-10-24 DIAGNOSIS — M84374S Stress fracture, right foot, sequela: Secondary | ICD-10-CM | POA: Diagnosis not present

## 2018-10-24 DIAGNOSIS — M84374A Stress fracture, right foot, initial encounter for fracture: Secondary | ICD-10-CM

## 2018-10-27 NOTE — Progress Notes (Signed)
  Subjective:  Patient ID: Kaitlyn Perez, female    DOB: 05-Jan-1956,  MRN: [id number]  No chief complaint on file.   63 y.o. female presents with the above complaint.  States that the area is doing much better not having much pain  Review of Systems: Negative except as noted in the HPI. Denies N/V/F/Ch.  Past Medical History:  Diagnosis Date  . Allergy   . Cancer [facility]) 2006   BCCleft upper side   . Cataract    are beginning   . Hyperlipidemia   . Osteopenia     Current Outpatient Medications:  .  alendronate (FOSAMAX) 70 MG tablet, Take 1 tablet (70 mg total) by mouth every 7 (seven) days. Take with a full glass of water on an empty stomach., Disp: 12 tablet, Rfl: 3 .  Biotin 10000 MCG TABS, Take 1 tablet by mouth daily., Disp: , Rfl:  .  conjugated estrogens (PREMARIN) vaginal cream, INSERT 0.86 (1/4) APPLICATORFUL VAGINALLY TWICE A WEEK., Disp: 30 g, Rfl: 2 .  meloxicam (MOBIC) 15 MG tablet, , Disp: , Rfl:  .  meloxicam (MOBIC) 7.5 MG tablet, 1-2 tab a day, Disp: 30 tablet, Rfl: 0 .  Multiple Vitamins-Minerals (CENTRUM PO), Take 1 tablet by mouth daily., Disp: , Rfl:  .  Multiple Vitamins-Minerals (PRESERVISION AREDS 2 PO), Take 1 tablet by mouth daily., Disp: , Rfl:  .  Omega-3 Fatty Acids (FISH OIL PO), Take 2,000 mg by mouth daily., Disp: , Rfl:  .  Probiotic Product (PRO-BIOTIC BLEND PO), Take by mouth., Disp: , Rfl:   Current Facility-Administered Medications:  .  0.9 %  sodium chloride infusion, 500 mL, Intravenous, Once, Nandigam, Venia Minks, MD  Social History   Tobacco Use  Smoking Status Never Smoker  Smokeless Tobacco Never Used    Allergies  Allergen Reactions  . Neosporin [Neomycin-Polymyxin-Gramicidin] Rash    Local rash   Objective:   There were no vitals filed for this visit. There is no height or weight on file to calculate BMI. Constitutional Well developed. Well nourished.  Vascular Dorsalis pedis pulses palpable bilaterally. Posterior tibial  pulses palpable bilaterally. Capillary refill normal to all digits.  No cyanosis or clubbing noted. Pedal hair growth normal.  Neurologic Normal speech. Oriented to person, place, and time. Epicritic sensation to light touch grossly present bilaterally.  Dermatologic Nails well groomed and normal in appearance. No open wounds. No skin lesions.  Orthopedic: Normal joint ROM without pain or crepitus bilaterally. No visible deformities. POP R 4th metatarsal. No pain 3rd interspace R.   Radiographs: Taken and reviewed. Osseous bridging noted. Assessment:   1. Stress fracture, right foot, sequela   2. Stress fracture, right foot, initial encounter for fracture    Plan:  Patient was evaluated and treated and all questions answered.  Stress Fracture R Foot -XR taken and reviewed. Bridging noted. -Discussed return to activity precautions  No follow-ups on file. New XR at that time.

## 2018-11-11 ENCOUNTER — Other Ambulatory Visit: Payer: Self-pay | Admitting: Family Medicine

## 2018-11-11 DIAGNOSIS — E2839 Other primary ovarian failure: Secondary | ICD-10-CM

## 2018-12-10 ENCOUNTER — Encounter: Payer: BC Managed Care – PPO | Admitting: Family Medicine

## 2018-12-26 ENCOUNTER — Encounter: Payer: Self-pay | Admitting: Family Medicine

## 2018-12-26 ENCOUNTER — Other Ambulatory Visit (HOSPITAL_COMMUNITY)
Admission: RE | Admit: 2018-12-26 | Discharge: 2018-12-26 | Disposition: A | Discharge status: Home / Self Care | Payer: BC Managed Care – PPO | Source: Ambulatory Visit | Attending: Family Medicine | Admitting: Family Medicine

## 2018-12-26 ENCOUNTER — Other Ambulatory Visit: Payer: Self-pay

## 2018-12-26 ENCOUNTER — Ambulatory Visit (INDEPENDENT_AMBULATORY_CARE_PROVIDER_SITE_OTHER): Payer: BC Managed Care – PPO | Admitting: Family Medicine

## 2018-12-26 VITALS — BP 120/76 | HR 50 | Temp 98.2°F | Resp 12 | Ht 64.0 in | Wt 138.0 lb

## 2018-12-26 DIAGNOSIS — M8580 Other specified disorders of bone density and structure, unspecified site: Secondary | ICD-10-CM

## 2018-12-26 DIAGNOSIS — Z01419 Encounter for gynecological examination (general) (routine) without abnormal findings: Secondary | ICD-10-CM | POA: Diagnosis present

## 2018-12-26 DIAGNOSIS — Z0000 Encounter for general adult medical examination without abnormal findings: Secondary | ICD-10-CM

## 2018-12-26 DIAGNOSIS — E2839 Other primary ovarian failure: Secondary | ICD-10-CM

## 2018-12-26 LAB — COMPREHENSIVE METABOLIC PANEL
ALT: 13 U/L (ref 0–35)
AST: 17 U/L (ref 0–37)
Albumin: 4.7 g/dL (ref 3.5–5.2)
Alkaline Phosphatase: 38 U/L — ABNORMAL LOW (ref 39–117)
BUN: 18 mg/dL (ref 6–23)
CALCIUM: 9.7 mg/dL (ref 8.4–10.5)
CO2: 28 mEq/L (ref 19–32)
Chloride: 103 mEq/L (ref 96–112)
Creatinine, Ser: 0.74 mg/dL (ref 0.40–1.20)
GFR: 79.38 mL/min (ref >60.00)
Glucose, Bld: 74 mg/dL (ref 70–99)
Potassium: 3.9 mEq/L (ref 3.5–5.1)
Sodium: 141 mEq/L (ref 135–145)
Total Bilirubin: 0.6 mg/dL (ref 0.2–1.2)
Total Protein: 6.8 g/dL (ref 6.0–8.3)

## 2018-12-26 LAB — CBC WITH DIFFERENTIAL/PLATELET
Basophils Absolute: 0 10*3/uL (ref 0.0–0.1)
Basophils Relative: 0.8 % (ref 0.0–3.0)
Eosinophils Absolute: 0 10*3/uL (ref 0.0–0.7)
Eosinophils Relative: 0.9 % (ref 0.0–5.0)
HCT: 41 % (ref 36.0–46.0)
Hemoglobin: 13.7 g/dL (ref 12.0–15.0)
Lymphocytes Relative: 35.2 % (ref 12.0–46.0)
Lymphs Abs: 1.4 10*3/uL (ref 0.7–4.0)
MCHC: 33.4 g/dL (ref 30.0–36.0)
MCV: 96.9 fl (ref 78.0–100.0)
MONOS PCT: 12.6 % — AB (ref 3.0–12.0)
Monocytes Absolute: 0.5 10*3/uL (ref 0.1–1.0)
Neutro Abs: 2.1 10*3/uL (ref 1.4–7.7)
Neutrophils Relative %: 50.5 % (ref 43.0–77.0)
Platelets: 236 10*3/uL (ref 150.0–400.0)
RBC: 4.23 Mil/uL (ref 3.87–5.11)
RDW: 14.3 % (ref 11.5–15.5)
WBC: 4.1 10*3/uL (ref 4.0–10.5)

## 2018-12-26 LAB — TSH: TSH: 3.38 u[IU]/mL (ref 0.35–4.50)

## 2018-12-26 LAB — LIPID PANEL
Cholesterol: 289 mg/dL — ABNORMAL HIGH (ref 0–200)
HDL: 101.4 mg/dL (ref >39.00)
LDL Cholesterol: 171 mg/dL — ABNORMAL HIGH (ref 0–99)
NonHDL: 188.07
Total CHOL/HDL Ratio: 3
Triglycerides: 87 mg/dL (ref 0.0–149.0)
VLDL: 17.4 mg/dL (ref 0.0–40.0)

## 2018-12-26 MED ORDER — ESTROGENS, CONJUGATED 0.625 MG/GM VA CREA: TOPICAL_CREAM | VAGINAL | 2 refills | Status: DC

## 2018-12-26 MED ORDER — ALENDRONATE SODIUM 70 MG PO TABS: 70.0000 mg | ORAL_TABLET | ORAL | 3 refills | Status: DC

## 2018-12-26 NOTE — Patient Instructions (Signed)
Preventive Care 40-64 Years, Female Preventive care refers to lifestyle choices and visits with your health care provider that can promote health and wellness. What does preventive care include?   A yearly physical exam. This is also called an annual well check.  Dental exams once or twice a year.  Routine eye exams. Ask your health care provider how often you should have your eyes checked.  Personal lifestyle choices, including: ? Daily care of your teeth and gums. ? Regular physical activity. ? Eating a healthy diet. ? Avoiding tobacco and drug use. ? Limiting alcohol use. ? Practicing safe sex. ? Taking low-dose aspirin daily starting at age 50. ? Taking vitamin and mineral supplements as recommended by your health care provider. What happens during an annual well check? The services and screenings done by your health care provider during your annual well check will depend on your age, overall health, lifestyle risk factors, and family history of disease. Counseling Your health care provider may ask you questions about your:  Alcohol use.  Tobacco use.  Drug use.  Emotional well-being.  Home and relationship well-being.  Sexual activity.  Eating habits.  Work and work environment.  Method of birth control.  Menstrual cycle.  Pregnancy history. Screening You may have the following tests or measurements:  Height, weight, and BMI.  Blood pressure.  Lipid and cholesterol levels. These may be checked every 5 years, or more frequently if you are over 50 years old.  Skin check.  Lung cancer screening. You may have this screening every year starting at age 55 if you have a 30-pack-year history of smoking and currently smoke or have quit within the past 15 years.  Colorectal cancer screening. All adults should have this screening starting at age 50 and continuing until age 75. Your health care provider may recommend screening at age 45. You will have tests every  1-10 years, depending on your results and the type of screening test. People at increased risk should start screening at an earlier age. Screening tests may include: ? Guaiac-based fecal occult blood testing. ? Fecal immunochemical test (FIT). ? Stool DNA test. ? Virtual colonoscopy. ? Sigmoidoscopy. During this test, a flexible tube with a tiny camera (sigmoidoscope) is used to examine your rectum and lower colon. The sigmoidoscope is inserted through your anus into your rectum and lower colon. ? Colonoscopy. During this test, a long, thin, flexible tube with a tiny camera (colonoscope) is used to examine your entire colon and rectum.  Hepatitis C blood test.  Hepatitis B blood test.  Sexually transmitted disease (STD) testing.  Diabetes screening. This is done by checking your blood sugar (glucose) after you have not eaten for a while (fasting). You may have this done every 1-3 years.  Mammogram. This may be done every 1-2 years. Talk to your health care provider about when you should start having regular mammograms. This may depend on whether you have a family history of breast cancer.  BRCA-related cancer screening. This may be done if you have a family history of breast, ovarian, tubal, or peritoneal cancers.  Pelvic exam and Pap test. This may be done every 3 years starting at age 21. Starting at age 30, this may be done every 5 years if you have a Pap test in combination with an HPV test.  Bone density scan. This is done to screen for osteoporosis. You may have this scan if you are at high risk for osteoporosis. Discuss your test results, treatment options,   and if necessary, the need for more tests with your health care provider. Vaccines Your health care provider may recommend certain vaccines, such as:  Influenza vaccine. This is recommended every year.  Tetanus, diphtheria, and acellular pertussis (Tdap, Td) vaccine. You may need a Td booster every 10 years.  Varicella  vaccine. You may need this if you have not been vaccinated.  Zoster vaccine. You may need this after age 38.  Measles, mumps, and rubella (MMR) vaccine. You may need at least one dose of MMR if you were born in 1957 or later. You may also need a second dose.  Pneumococcal 13-valent conjugate (PCV13) vaccine. You may need this if you have certain conditions and were not previously vaccinated.  Pneumococcal polysaccharide (PPSV23) vaccine. You may need one or two doses if you smoke cigarettes or if you have certain conditions.  Meningococcal vaccine. You may need this if you have certain conditions.  Hepatitis A vaccine. You may need this if you have certain conditions or if you travel or work in places where you may be exposed to hepatitis A.  Hepatitis B vaccine. You may need this if you have certain conditions or if you travel or work in places where you may be exposed to hepatitis B.  Haemophilus influenzae type b (Hib) vaccine. You may need this if you have certain conditions. Talk to your health care provider about which screenings and vaccines you need and how often you need them. This information is not intended to replace advice given to you by your health care provider. Make sure you discuss any questions you have with your health care provider. Document Released: 10/29/2015 Document Revised: 11/22/2017 Document Reviewed: 08/03/2015 Elsevier Interactive Patient Education  2019 [employer].

## 2018-12-26 NOTE — Progress Notes (Signed)
Subjective:     Kaitlyn Perez is a 63 y.o. female and is here for a comprehensive physical exam. The patient reports no problems.  Social History   Socioeconomic History  . Marital status: Married    Spouse name: [name]  . Number of children: Not on file  . Years of education: 26  . Highest education level: Not on file  Occupational History  . Occupation: Advertising account planner VP    Employer: [employer]  . Financial resource strain: Not on file  . Food insecurity:    Worry: Not on file    Inability: Not on file  . Transportation needs:    Medical: Not on file    Non-medical: Not on file  Tobacco Use  . Smoking status: Never Smoker  . Smokeless tobacco: Never Used  Substance and Sexual Activity  . Alcohol use: Yes    Comment: rare--1 x a month  . Drug use: No  . Sexual activity: Yes    Partners: Male  Lifestyle  . Physical activity:    Days per week: Not on file    Minutes per session: Not on file  . Stress: Not on file  Relationships  . Social connections:    Talks on phone: Not on file    Gets together: Not on file    Attends religious service: Not on file    Active member of club or organization: Not on file    Attends meetings of clubs or organizations: Not on file    Relationship status: Not on file  . Intimate partner violence:    Fear of current or ex partner: Not on file    Emotionally abused: Not on file    Physically abused: Not on file    Forced sexual activity: Not on file  Other Topics Concern  . Not on file  Social History Narrative   Exercise--yes, at least 4 days a week   Health Maintenance  Topic Date Due  . PAP SMEAR-Modifier  01/10/2019  . HIV Screening  12/04/2023 (Originally 06/11/1971)  . DEXA SCAN  01/18/2019  . MAMMOGRAM  01/24/2019  . TETANUS/TDAP  01/10/2023  . COLONOSCOPY  07/02/2028  . INFLUENZA VACCINE  Completed  . Hepatitis C Screening  Completed    The following portions of the patient's history were  reviewed and updated as appropriate:  She  has a past medical history of Allergy, Cancer ([place]) (2006), Cataract, Hyperlipidemia, and Osteopenia. She does not have any pertinent problems on file. She  has a past surgical history that includes Nasal sinus surgery (2001); Tonsillectomy and adenoidectomy (1967); Mandible surgery (Bilateral, 08/2012); Colonoscopy; and Polypectomy. Her family history includes Arthritis in her mother; Breast cancer in her maternal aunt and another family member; Cancer in her brother; Cancer (age of onset: 68) in her mother; Colon cancer in her paternal uncle; Colon polyps in her father; Diabetes in her father; Heart disease (age of onset: 22) in her maternal grandfather; Heart disease (age of onset: 25) in her maternal grandmother; Hyperlipidemia in her father and mother; Hypertension in her father; Hypotension in her mother; Lung cancer in her father and paternal uncle; Stroke in her father; Uterine cancer in her paternal aunt. She  reports that she has never smoked. She has never used smokeless tobacco. She reports current alcohol use. She reports that she does not use drugs. She has a current medication list which includes the following prescription(s): biotin, multiple  vitamins-minerals, multiple vitamins-minerals, omega-3 fatty acids, probiotic product, alendronate, and conjugated estrogens, and the following Facility-Administered Medications: sodium chloride. Current Outpatient Medications on File Prior to Visit  Medication Sig Dispense Refill  . Biotin 10000 MCG TABS Take 1 tablet by mouth daily.    . Multiple Vitamins-Minerals (CENTRUM PO) Take 1 tablet by mouth daily.    . Multiple Vitamins-Minerals (PRESERVISION AREDS 2 PO) Take 1 tablet by mouth daily.    . Omega-3 Fatty Acids (FISH OIL PO) Take 2,000 mg by mouth daily.    . Probiotic Product (PRO-BIOTIC BLEND PO) Take by mouth.     Current Facility-Administered Medications on File Prior to Visit  Medication  Dose Route Frequency Provider Last Rate Last Dose  . 0.9 %  sodium chloride infusion  500 mL Intravenous Once Nandigam, Venia Minks, MD       She is allergic to neosporin [neomycin-polymyxin-gramicidin]..  Review of Systems Review of Systems  Constitutional: Negative for activity change, appetite change and fatigue.  HENT: Negative for hearing loss, congestion, tinnitus and ear discharge.  dentist q60m Eyes: Negative for visual disturbance (see optho q1y -- vision corrected to 20/20 with glasses).  Respiratory: Negative for cough, chest tightness and shortness of breath.   Cardiovascular: Negative for chest pain, palpitations and leg swelling.  Gastrointestinal: Negative for abdominal pain, diarrhea, constipation and abdominal distention.  Genitourinary: Negative for urgency, frequency, decreased urine volume and difficulty urinating.  Musculoskeletal: Negative for back pain, arthralgias and gait problem.  Skin: Negative for color change, pallor and rash.  Neurological: Negative for dizziness, light-headedness, numbness and headaches.  Hematological: Negative for adenopathy. Does not bruise/bleed easily.  Psychiatric/Behavioral: Negative for suicidal ideas, confusion, sleep disturbance, self-injury, dysphoric mood, decreased concentration and agitation.      Objective:    BP 120/76 (BP Location: Right Arm, Cuff Size: Normal)   Pulse (!) 50   Temp 98.2 F (36.8 C) (Oral)   Resp 12   Ht 5\' 4"  (1.626 m)   Wt 138 lb (62.6 kg)   SpO2 98%   BMI 23.69 kg/m  General appearance: alert, cooperative, appears stated age and no distress Head: Normocephalic, without obvious abnormality, atraumatic Eyes: conjunctivae/corneas clear. PERRL, EOM's intact. Fundi benign. Ears: normal TM's and external ear canals both ears Nose: Nares normal. Septum midline. Mucosa normal. No drainage or sinus tenderness. Throat: lips, mucosa, and tongue normal; teeth and gums normal Neck: no adenopathy, no carotid  bruit, no JVD, supple, symmetrical, trachea midline and thyroid not enlarged, symmetric, no tenderness/mass/nodules Back: symmetric, no curvature. ROM normal. No CVA tenderness. Lungs: clear to auscultation bilaterally Breasts: normal appearance, no masses or tenderness Heart: regular rate and rhythm, S1, S2 normal, no murmur, click, rub or gallop Abdomen: soft, non-tender; bowel sounds normal; no masses,  no organomegaly Pelvic: cervix normal in appearance, external genitalia normal, no adnexal masses or tenderness, no cervical motion tenderness, rectovaginal septum normal, uterus normal size, shape, and consistency, vagina normal without discharge and pap done, rectal heme neg brown stool Extremities: extremities normal, atraumatic, no cyanosis or edema Pulses: 2+ and symmetric Skin: Skin color, texture, turgor normal. No rashes or lesions Lymph nodes: Cervical, supraclavicular, and axillary nodes normal. Neurologic: Alert and oriented X 3, normal strength and tone. Normal symmetric reflexes. Normal coordination and gait    Assessment:    Healthy female exam.      Plan:    ghm utd Check labs See After Visit Summary for Counseling Recommendations

## 2018-12-30 LAB — CYTOLOGY - PAP
Adequacy: ABSENT
DIAGNOSIS: NEGATIVE
HPV (WINDOPATH): NOT DETECTED

## 2018-12-31 ENCOUNTER — Encounter: Payer: Self-pay | Admitting: Family Medicine

## 2019-01-07 ENCOUNTER — Encounter: Payer: Self-pay | Admitting: Family Medicine

## 2019-02-14 ENCOUNTER — Other Ambulatory Visit: Payer: Self-pay | Admitting: Family Medicine

## 2019-02-14 ENCOUNTER — Encounter: Payer: Self-pay | Admitting: Family Medicine

## 2019-02-17 ENCOUNTER — Other Ambulatory Visit: Payer: Self-pay | Admitting: Family Medicine

## 2019-02-17 DIAGNOSIS — E2839 Other primary ovarian failure: Secondary | ICD-10-CM

## 2019-02-17 DIAGNOSIS — Z1239 Encounter for other screening for malignant neoplasm of breast: Secondary | ICD-10-CM

## 2019-02-17 NOTE — Telephone Encounter (Signed)
Referral put in for breast center

## 2019-03-06 ENCOUNTER — Encounter: Payer: BC Managed Care – PPO | Admitting: Family Medicine

## 2019-04-02 IMAGING — DX DG FOOT COMPLETE 3+V*R*
3 series · 3 of 3 positions shown · non-contrast
Comparison: None.

CLINICAL DATA: Right foot pain since [REDACTED]. Pain in the top and
bottom of the foot around the fourth and fifth metatarsal bones.

EXAM:
RIGHT FOOT COMPLETE - 3+ VIEW

[foot ap]
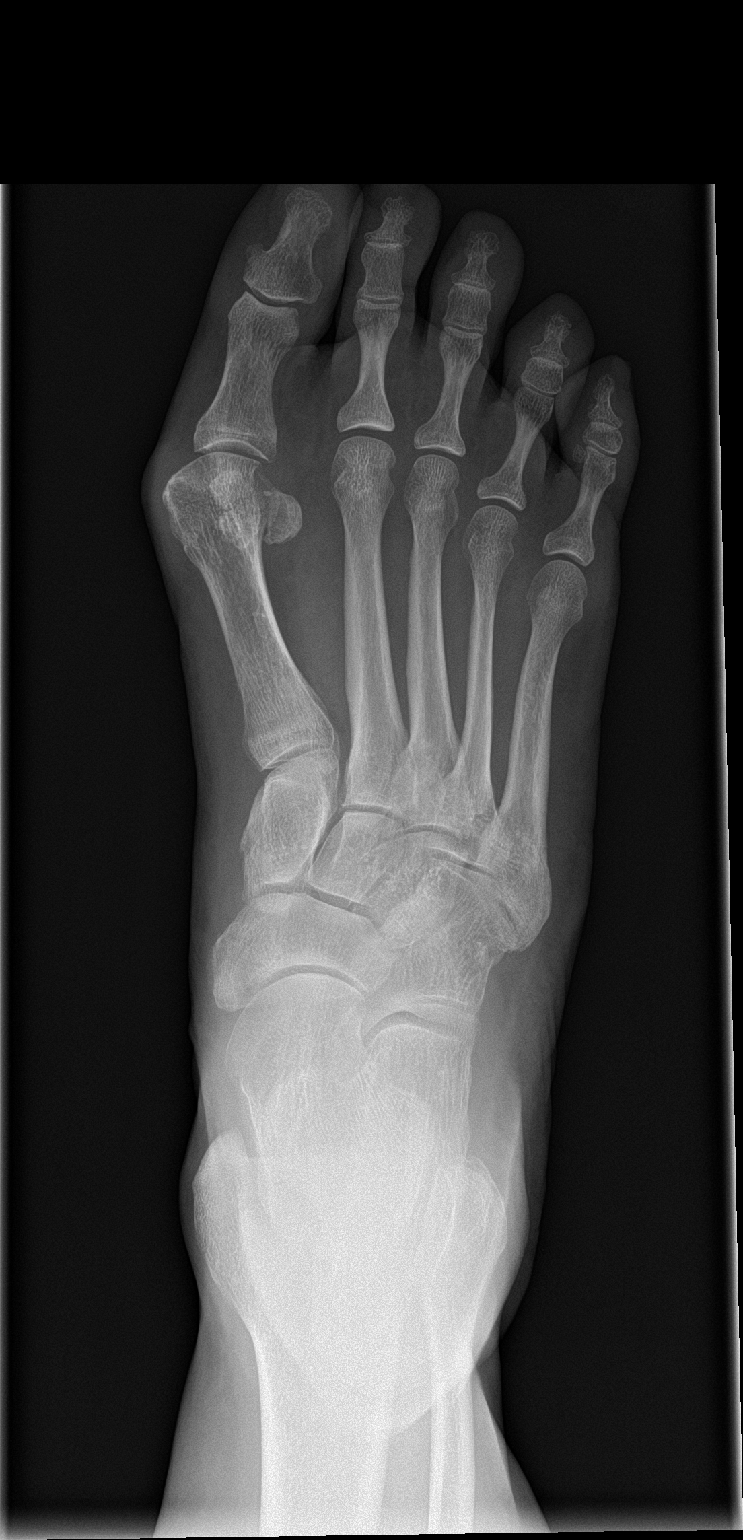

[foot obl]
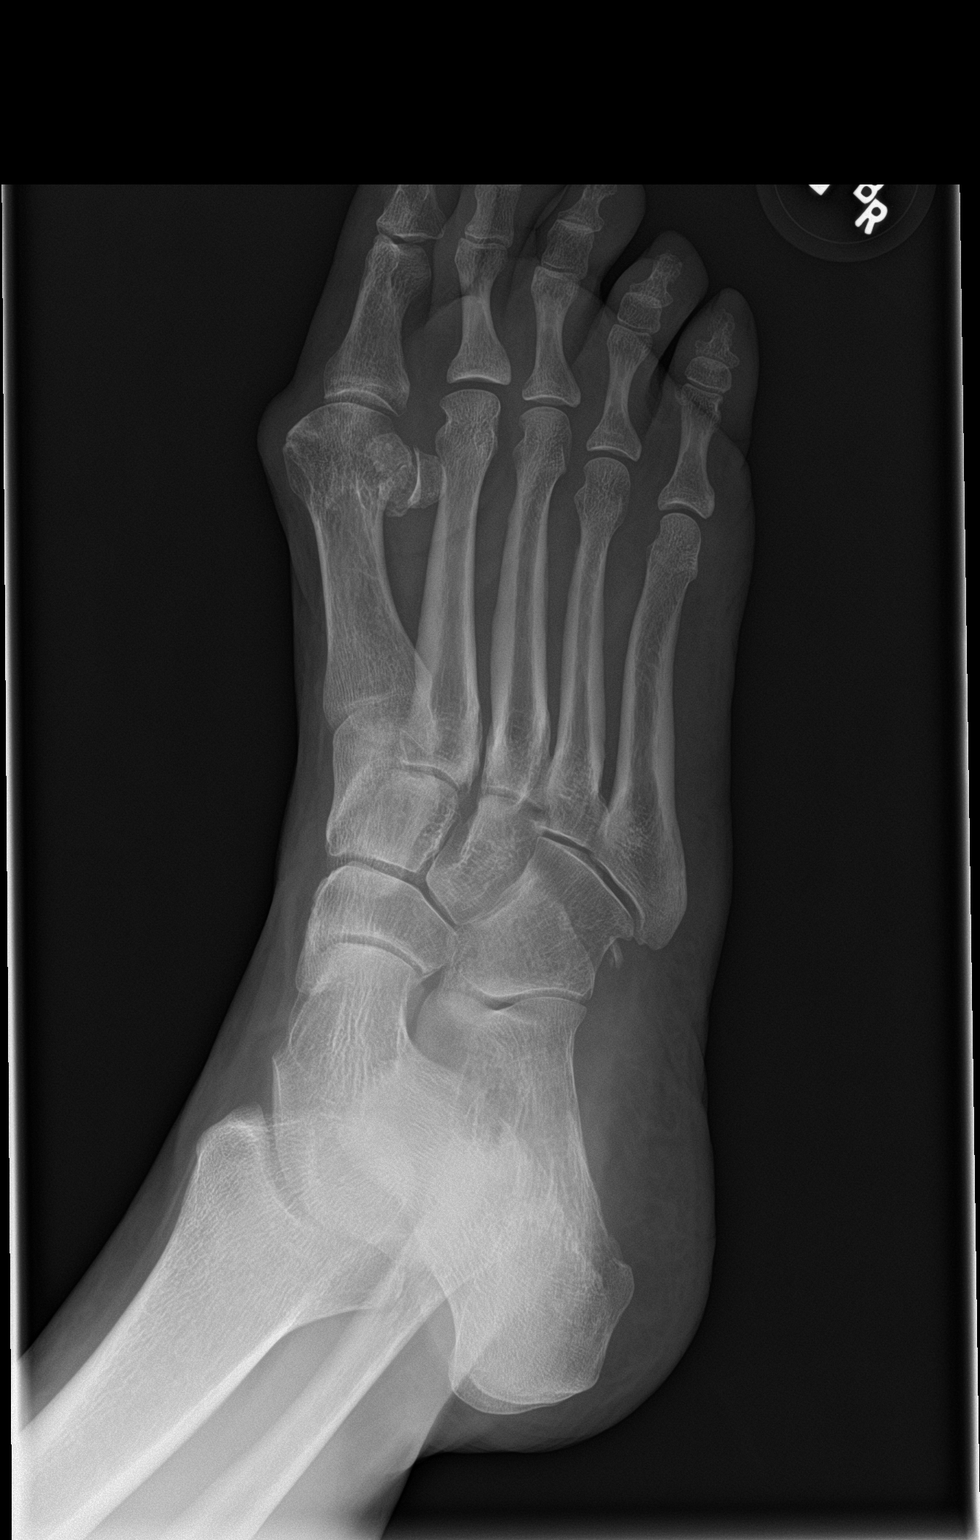

[foot lat]
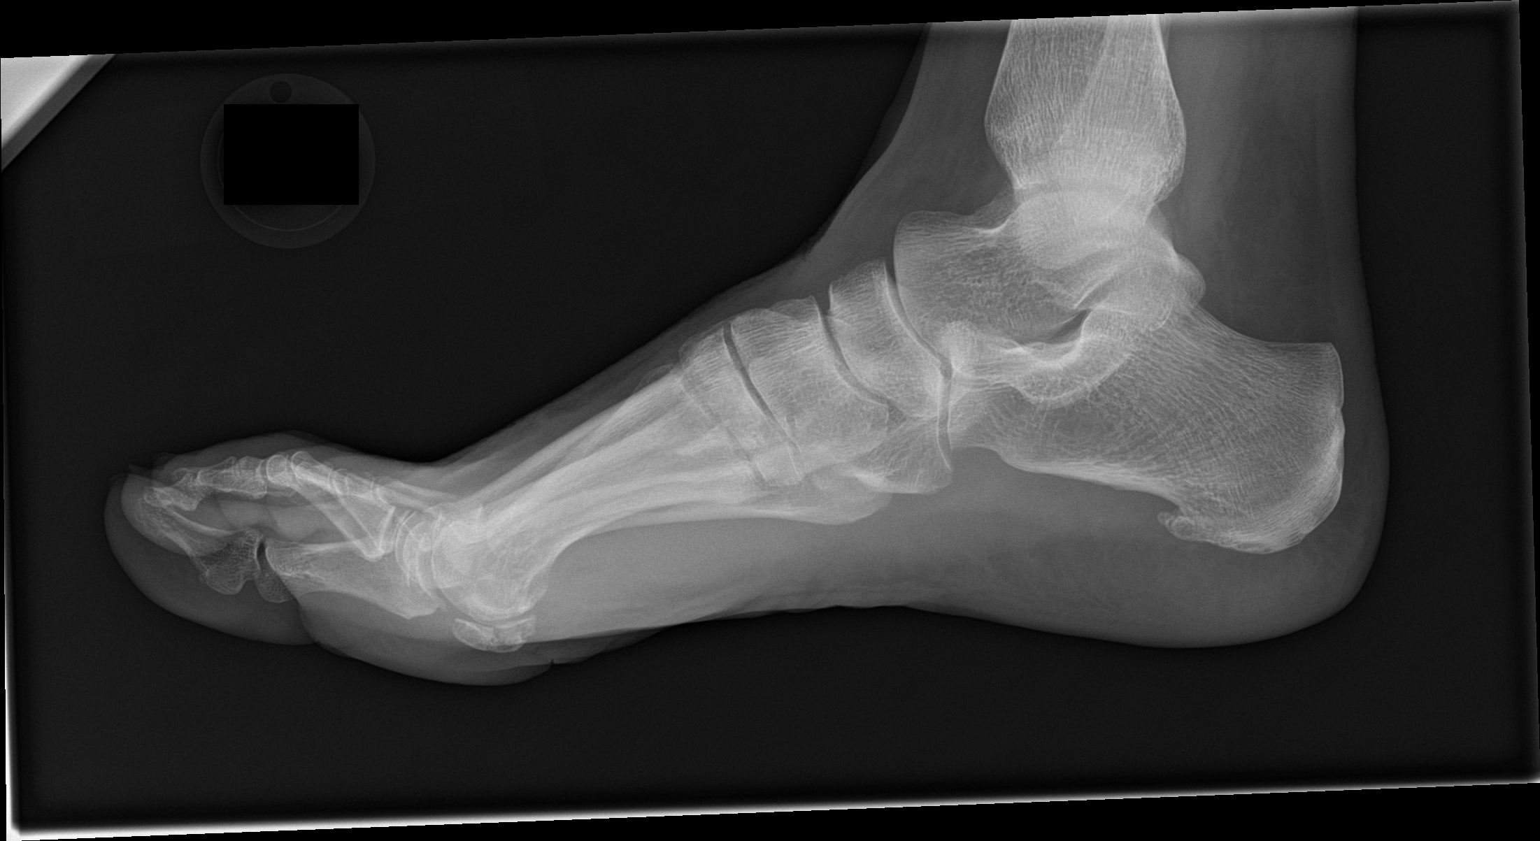

[3 of 3 positions shown; findings below may reference images not displayed]

FINDINGS: Hallux valgus deformity. Negative for fracture or dislocation. There
is a prominent plantar calcaneal spur. No focal soft tissue
abnormality.
IMPRESSION: No acute bone abnormality to the right foot.

Hallux valgus deformity.

Calcaneal spur.

## 2019-04-27 NOTE — Progress Notes (Signed)
  Subjective:  Patient ID: Kaitlyn Perez, female    DOB: Nov 14, 1955,  MRN: [id number]  Chief Complaint  Patient presents with  . Fracture    right foot follow up; pt stated, "doing great, no pain, no new concerns"    63 y.o. female presents with the above complaint. Doing well denies pain, would like to get out of the boot.  Review of Systems: Negative except as noted in the HPI. Denies N/V/F/Ch.  Past Medical History:  Diagnosis Date  . Allergy   . Cancer [facility]) 2006   BCCleft upper side   . Cataract    are beginning   . Hyperlipidemia   . Osteopenia     Current Outpatient Medications:  .  Biotin 10000 MCG TABS, Take 1 tablet by mouth daily., Disp: , Rfl:  .  Multiple Vitamins-Minerals (CENTRUM PO), Take 1 tablet by mouth daily., Disp: , Rfl:  .  Multiple Vitamins-Minerals (PRESERVISION AREDS 2 PO), Take 1 tablet by mouth daily., Disp: , Rfl:  .  Omega-3 Fatty Acids (FISH OIL PO), Take 2,000 mg by mouth daily., Disp: , Rfl:  .  Probiotic Product (PRO-BIOTIC BLEND PO), Take by mouth., Disp: , Rfl:  .  alendronate (FOSAMAX) 70 MG tablet, Take 1 tablet (70 mg total) by mouth every 7 (seven) days. Take with a full glass of water on an empty stomach., Disp: 12 tablet, Rfl: 3 .  conjugated estrogens (PREMARIN) vaginal cream, INSERT 1/4 APPLICATORFUL VAGINALLY TWICEWEEKLY, Disp: 30 g, Rfl: 2  Current Facility-Administered Medications:  .  0.9 %  sodium chloride infusion, 500 mL, Intravenous, Once, Nandigam, Venia Minks, MD  Social History   Tobacco Use  Smoking Status Never Smoker  Smokeless Tobacco Never Used    Allergies  Allergen Reactions  . Neosporin [Neomycin-Polymyxin-Gramicidin] Rash    Local rash   Objective:   There were no vitals filed for this visit. There is no height or weight on file to calculate BMI. Constitutional Well developed. Well nourished.  Vascular Dorsalis pedis pulses palpable bilaterally. Posterior tibial pulses palpable bilaterally. Capillary  refill normal to all digits.  No cyanosis or clubbing noted. Pedal hair growth normal.  Neurologic Normal speech. Oriented to person, place, and time. Epicritic sensation to light touch grossly present bilaterally.  Dermatologic Nails well groomed and normal in appearance. No open wounds. No skin lesions.  Orthopedic: Normal joint ROM without pain or crepitus bilaterally. No visible deformities. POP R 4th metatarsal. No pain 3rd interspace R.   Radiographs: Taken and reviewed. Interval Stress fracture healing. Assessment:   1. Stress fracture, right foot, initial encounter for fracture   2. Post-operative state    Plan:  Patient was evaluated and treated and all questions answered.  Stress Fracture R Foot -XR today show healing continue immobilization.  No follow-ups on file. New XR at that time.

## 2019-05-05 ENCOUNTER — Other Ambulatory Visit: Payer: BC Managed Care – PPO

## 2019-05-05 ENCOUNTER — Ambulatory Visit: Payer: BC Managed Care – PPO

## 2019-07-15 ENCOUNTER — Other Ambulatory Visit: Payer: Self-pay

## 2019-07-15 ENCOUNTER — Ambulatory Visit
Admission: RE | Admit: 2019-07-15 | Discharge: 2019-07-15 | Disposition: A | Discharge status: Home / Self Care | Payer: BC Managed Care – PPO | Source: Ambulatory Visit | Attending: Family Medicine | Admitting: Family Medicine

## 2019-07-15 ENCOUNTER — Encounter: Payer: Self-pay | Admitting: Family Medicine

## 2019-07-15 DIAGNOSIS — E2839 Other primary ovarian failure: Secondary | ICD-10-CM

## 2019-07-15 DIAGNOSIS — Z1239 Encounter for other screening for malignant neoplasm of breast: Secondary | ICD-10-CM

## 2019-07-15 NOTE — Telephone Encounter (Signed)
That is exactly right--- the fosamax worked --- keep taking it ---- for 5 years total

## 2019-07-17 ENCOUNTER — Other Ambulatory Visit: Payer: Self-pay

## 2019-07-17 ENCOUNTER — Ambulatory Visit (INDEPENDENT_AMBULATORY_CARE_PROVIDER_SITE_OTHER): Payer: BC Managed Care – PPO

## 2019-07-17 DIAGNOSIS — Z23 Encounter for immunization: Secondary | ICD-10-CM

## 2019-09-22 ENCOUNTER — Encounter: Payer: Self-pay | Admitting: Family Medicine

## 2019-09-22 NOTE — Telephone Encounter (Signed)
That would be more diffuse-- not just one leg If she passes screening we can do ov --- or video visit

## 2019-09-26 ENCOUNTER — Encounter: Payer: Self-pay | Admitting: Family Medicine

## 2019-09-26 ENCOUNTER — Ambulatory Visit (INDEPENDENT_AMBULATORY_CARE_PROVIDER_SITE_OTHER): Payer: BC Managed Care – PPO | Admitting: Family Medicine

## 2019-09-26 ENCOUNTER — Other Ambulatory Visit: Payer: Self-pay

## 2019-09-26 VITALS — Ht 64.0 in

## 2019-09-26 DIAGNOSIS — M79651 Pain in right thigh: Secondary | ICD-10-CM | POA: Diagnosis not present

## 2019-09-26 MED ORDER — MELOXICAM 15 MG PO TABS: 15.0000 mg | ORAL_TABLET | Freq: Every day | ORAL | 0 refills | Status: DC

## 2019-09-26 MED ORDER — CYCLOBENZAPRINE HCL 10 MG PO TABS: 10.0000 mg | ORAL_TABLET | Freq: Three times a day (TID) | ORAL | 0 refills | Status: DC | PRN

## 2019-09-26 NOTE — Progress Notes (Signed)
Virtual Visit via Telephone Note  I connected with Kaitlyn Perez on 09/26/19 at  8:00 AM EST by telephone and verified that I am speaking with the correct person using two identifiers.  Location: Patient: home alone  Provider: office    I discussed the limitations, risks, security and privacy concerns of performing an evaluation and management service by telephone and the availability of in person appointments. I also discussed with the patient that there may be a patient responsible charge related to this service. The patient expressed understanding and agreed to proceed.   History of Present Illness: Pt is home c/o R thigh pain x 3 months.  She has trouble getting up from sitting -- it seems better today but it is bothering her L thigh some too.  She wondered if the fosamax was causing it.     Observations/Objective: No vitals obtained Pt is in NAD   Assessment and Plan: 1. Pain of right thigh Pt driving back from [place] today.   Pain is somewhat better mobic and muscle relaxer per orders Ov next week unless improved - meloxicam (MOBIC) 15 MG tablet; Take 1 tablet (15 mg total) by mouth daily.  Dispense: 30 tablet; Refill: 0 - cyclobenzaprine (FLEXERIL) 10 MG tablet; Take 1 tablet (10 mg total) by mouth 3 (three) times daily as needed for muscle spasms.  Dispense: 30 tablet; Refill: 0   Follow Up Instructions:    I discussed the assessment and treatment plan with the patient. The patient was provided an opportunity to ask questions and all were answered. The patient agreed with the plan and demonstrated an understanding of the instructions.   The patient was advised to call back or seek an in-person evaluation if the symptoms worsen or if the condition fails to improve as anticipated.  I provided 15 minutes of non-face-to-face time during this encounter.   Ann Held, DO

## 2019-10-23 ENCOUNTER — Encounter: Payer: Self-pay | Admitting: Family Medicine

## 2019-10-24 NOTE — Telephone Encounter (Signed)
It the pain is no different off the fosamax she can restart it again She could take the meloxicam a little longer to see if the pain resolves but if it does not we can refer to [facility]

## 2019-12-25 ENCOUNTER — Other Ambulatory Visit: Payer: Self-pay

## 2019-12-29 ENCOUNTER — Other Ambulatory Visit: Payer: Self-pay

## 2019-12-30 ENCOUNTER — Ambulatory Visit (INDEPENDENT_AMBULATORY_CARE_PROVIDER_SITE_OTHER): Payer: BC Managed Care – PPO | Admitting: Family Medicine

## 2019-12-30 ENCOUNTER — Encounter: Payer: Self-pay | Admitting: Family Medicine

## 2019-12-30 ENCOUNTER — Telehealth: Payer: Self-pay

## 2019-12-30 ENCOUNTER — Other Ambulatory Visit: Payer: Self-pay | Admitting: Family Medicine

## 2019-12-30 VITALS — BP 110/80 | HR 58 | Temp 97.8°F | Resp 18 | Ht 64.0 in | Wt 135.4 lb

## 2019-12-30 DIAGNOSIS — Z0000 Encounter for general adult medical examination without abnormal findings: Secondary | ICD-10-CM | POA: Diagnosis not present

## 2019-12-30 DIAGNOSIS — M8580 Other specified disorders of bone density and structure, unspecified site: Secondary | ICD-10-CM

## 2019-12-30 DIAGNOSIS — Z136 Encounter for screening for cardiovascular disorders: Secondary | ICD-10-CM | POA: Diagnosis not present

## 2019-12-30 DIAGNOSIS — Z780 Asymptomatic menopausal state: Secondary | ICD-10-CM

## 2019-12-30 DIAGNOSIS — E2839 Other primary ovarian failure: Secondary | ICD-10-CM | POA: Diagnosis not present

## 2019-12-30 LAB — LIPID PANEL
Cholesterol: 308 mg/dL — ABNORMAL HIGH (ref 0–200)
HDL: 89.5 mg/dL (ref >39.00)
LDL Cholesterol: 198 mg/dL — ABNORMAL HIGH (ref 0–99)
NonHDL: 218.89
Total CHOL/HDL Ratio: 3
Triglycerides: 102 mg/dL (ref 0.0–149.0)
VLDL: 20.4 mg/dL (ref 0.0–40.0)

## 2019-12-30 LAB — COMPREHENSIVE METABOLIC PANEL
ALT: 12 U/L (ref 0–35)
AST: 15 U/L (ref 0–37)
Albumin: 4.3 g/dL (ref 3.5–5.2)
Alkaline Phosphatase: 36 U/L — ABNORMAL LOW (ref 39–117)
BUN: 13 mg/dL (ref 6–23)
CO2: 30 mEq/L (ref 19–32)
Calcium: 9.9 mg/dL (ref 8.4–10.5)
Chloride: 105 mEq/L (ref 96–112)
Creatinine, Ser: 0.7 mg/dL (ref 0.40–1.20)
GFR: 84.36 mL/min (ref >60.00)
Glucose, Bld: 91 mg/dL (ref 70–99)
Potassium: 3.8 mEq/L (ref 3.5–5.1)
Sodium: 141 mEq/L (ref 135–145)
Total Bilirubin: 0.7 mg/dL (ref 0.2–1.2)
Total Protein: 6.7 g/dL (ref 6.0–8.3)

## 2019-12-30 LAB — TSH: TSH: 4.9 u[IU]/mL — ABNORMAL HIGH (ref 0.35–4.50)

## 2019-12-30 LAB — CBC WITH DIFFERENTIAL/PLATELET
Basophils Absolute: 0 10*3/uL (ref 0.0–0.1)
Basophils Relative: 1.4 % (ref 0.0–3.0)
Eosinophils Absolute: 0.1 10*3/uL (ref 0.0–0.7)
Eosinophils Relative: 1.5 % (ref 0.0–5.0)
HCT: 41.3 % (ref 36.0–46.0)
Hemoglobin: 13.7 g/dL (ref 12.0–15.0)
Lymphocytes Relative: 30.8 % (ref 12.0–46.0)
Lymphs Abs: 1.1 10*3/uL (ref 0.7–4.0)
MCHC: 33.1 g/dL (ref 30.0–36.0)
MCV: 97.1 fl (ref 78.0–100.0)
Monocytes Absolute: 0.5 10*3/uL (ref 0.1–1.0)
Monocytes Relative: 14.4 % — ABNORMAL HIGH (ref 3.0–12.0)
Neutro Abs: 1.8 10*3/uL (ref 1.4–7.7)
Neutrophils Relative %: 51.9 % (ref 43.0–77.0)
Platelets: 273 10*3/uL (ref 150.0–400.0)
RBC: 4.25 Mil/uL (ref 3.87–5.11)
RDW: 13.6 % (ref 11.5–15.5)
WBC: 3.5 10*3/uL — ABNORMAL LOW (ref 4.0–10.5)

## 2019-12-30 LAB — VITAMIN D 25 HYDROXY (VIT D DEFICIENCY, FRACTURES): VITD: 30.68 ng/mL (ref 30.00–100.00)

## 2019-12-30 MED ORDER — ALENDRONATE SODIUM 70 MG PO TABS: 70.0000 mg | ORAL_TABLET | ORAL | 3 refills | Status: DC

## 2019-12-30 MED ORDER — PREMARIN 0.625 MG/GM VA CREA: TOPICAL_CREAM | VAGINAL | 2 refills | Status: DC

## 2019-12-30 MED ORDER — ESTRADIOL 0.1 MG/GM VA CREA: 1.0000 | TOPICAL_CREAM | Freq: Every day | VAGINAL | 12 refills | Status: DC

## 2019-12-30 NOTE — Patient Instructions (Signed)

## 2019-12-30 NOTE — Telephone Encounter (Signed)
Changed to   estradiol (ESTRACE VAGINAL) 0.1 MG/GM vaginal cream 42.5 g 12 12/30/2019  Sig:   Place 1 Applicatorful vaginally at bedtime.   Route:   Vaginal

## 2019-12-30 NOTE — Progress Notes (Signed)
Subjective:     Kaitlyn Perez is a 64 y.o. female and is here for a comprehensive physical exam. The patient reports no problems.  Social History   Socioeconomic History  . Marital status: Married    Spouse name: Meco Bazer  . Number of children: Not on file  . Years of education: 65  . Highest education level: Not on file  Occupational History  . Occupation: IT sales professional: [employer]  Tobacco Use  . Smoking status: Never Smoker  . Smokeless tobacco: Never Used  Substance and Sexual Activity  . Alcohol use: Yes    Comment: rare--1 x a month  . Drug use: No  . Sexual activity: Yes    Partners: Male  Other Topics Concern  . Not on file  Social History Narrative   Exercise--yes, at least 4 days a week   Social Determinants of Health   Financial Resource Strain:   . Difficulty of Paying Living Expenses:   Food Insecurity:   . Worried About Charity fundraiser in the Last Year:   . Arboriculturist in the Last Year:   Transportation Needs:   . Film/video editor (Medical):   Marland Kitchen Lack of Transportation (Non-Medical):   Physical Activity:   . Days of Exercise per Week:   . Minutes of Exercise per Session:   Stress:   . Feeling of Stress :   Social Connections:   . Frequency of Communication with Friends and Family:   . Frequency of Social Gatherings with Friends and Family:   . Attends Religious Services:   . Active Member of Clubs or Organizations:   . Attends Archivist Meetings:   Marland Kitchen Marital Status:   Intimate Partner Violence:   . Fear of Current or Ex-Partner:   . Emotionally Abused:   Marland Kitchen Physically Abused:   . Sexually Abused:    Health Maintenance  Topic Date Due  . HIV Screening  12/04/2023 (Originally 06/11/1971)  . MAMMOGRAM  07/14/2020  . DEXA SCAN  07/14/2021  . PAP SMEAR-Modifier  12/25/2021  . TETANUS/TDAP  01/10/2023  . COLONOSCOPY  07/02/2028  . INFLUENZA VACCINE  Completed  . Hepatitis C Screening  Completed    The  following portions of the patient's history were reviewed and updated as appropriate:  She  has a past medical history of Allergy, Cancer ([place]) (2006), Cataract, Hyperlipidemia, and Osteopenia. She does not have any pertinent problems on file. She  has a past surgical history that includes Nasal sinus surgery (2001); Tonsillectomy and adenoidectomy (1967); Mandible surgery (Bilateral, 08/2012); Colonoscopy; Polypectomy; Breast biopsy (Right); and Breast cyst aspiration. Her family history includes Arthritis in her mother; Breast cancer in her maternal aunt and another family member; Cancer in her brother; Cancer (age of onset: 28) in her mother; Colon cancer in her paternal uncle; Colon polyps in her father; Diabetes in her father; Heart disease (age of onset: 58) in her maternal grandfather; Heart disease (age of onset: 76) in her maternal grandmother; Hyperlipidemia in her father and mother; Hypertension in her father; Hypotension in her mother; Lung cancer in her father and paternal uncle; Macular degeneration in her mother; Melanoma in her father; Stroke in her father and mother; Uterine cancer in her paternal aunt. She  reports that she has never smoked. She has never used smokeless tobacco. She reports current alcohol use. She reports that she does not use drugs. She has a current medication list which  includes the following prescription(s): alendronate, premarin, biotin, multiple vitamins-minerals, multiple vitamins-minerals, omega-3 fatty acids, and probiotic product, and the following Facility-Administered Medications: sodium chloride. Current Outpatient Medications on File Prior to Visit  Medication Sig Dispense Refill  . Biotin 10000 MCG TABS Take 1 tablet by mouth daily.    . Multiple Vitamins-Minerals (CENTRUM PO) Take 1 tablet by mouth daily.    . Multiple Vitamins-Minerals (PRESERVISION AREDS 2 PO) Take 1 tablet by mouth daily.    . Omega-3 Fatty Acids (FISH OIL PO) Take 2,000 mg by mouth  daily.    . Probiotic Product (PRO-BIOTIC BLEND PO) Take by mouth.     Current Facility-Administered Medications on File Prior to Visit  Medication Dose Route Frequency Provider Last Rate Last Admin  . 0.9 %  sodium chloride infusion  500 mL Intravenous Once Nandigam, Venia Minks, MD       She is allergic to neosporin [neomycin-polymyxin-gramicidin]..  Review of Systems Review of Systems  Constitutional: Negative for activity change, appetite change and fatigue.  HENT: Negative for hearing loss, congestion, tinnitus and ear discharge.  dentist q50m Eyes: Negative for visual disturbance (see optho q1y -- vision corrected to 20/20 with glasses).  Respiratory: Negative for cough, chest tightness and shortness of breath.   Cardiovascular: Negative for chest pain, palpitations and leg swelling.  Gastrointestinal: Negative for abdominal pain, diarrhea, constipation and abdominal distention.  Genitourinary: Negative for urgency, frequency, decreased urine volume and difficulty urinating.  Musculoskeletal: Negative for back pain, arthralgias and gait problem.  Skin: Negative for color change, pallor and rash.  Neurological: Negative for dizziness, light-headedness, numbness and headaches.  Hematological: Negative for adenopathy. Does not bruise/bleed easily.  Psychiatric/Behavioral: Negative for suicidal ideas, confusion, sleep disturbance, self-injury, dysphoric mood, decreased concentration and agitation.       Objective:    BP 110/80 (BP Location: Left Arm, Patient Position: Sitting, Cuff Size: Normal)   Pulse (!) 58   Temp 97.8 F (36.6 C) (Temporal)   Resp 18   Ht 5\' 4"  (1.626 m)   Wt 135 lb 6.4 oz (61.4 kg)   SpO2 99%   BMI 23.24 kg/m  General appearance: alert, cooperative, appears stated age and no distress Head: Normocephalic, without obvious abnormality, atraumatic Eyes: conjunctivae/corneas clear. PERRL, EOM's intact. Fundi benign. Ears: normal TM's and external ear canals  both ears Neck: no adenopathy, no carotid bruit, no JVD, supple, symmetrical, trachea midline and thyroid not enlarged, symmetric, no tenderness/mass/nodules Back: symmetric, no curvature. ROM normal. No CVA tenderness. Lungs: clear to auscultation bilaterally Breasts: normal appearance, no masses or tenderness Heart: regular rate and rhythm, S1, S2 normal, no murmur, click, rub or gallop Abdomen: soft, non-tender; bowel sounds normal; no masses,  no organomegaly Pelvic: deferred Extremities: extremities normal, atraumatic, no cyanosis or edema Pulses: 2+ and symmetric Skin: Skin color, texture, turgor normal. No rashes or lesions Lymph nodes: Cervical, supraclavicular, and axillary nodes normal. Neurologic: Alert and oriented X 3, normal strength and tone. Normal symmetric reflexes. Normal coordination and gait    Assessment:    Healthy female exam.      Plan:     ghm utd Check labs  See After Visit Summary for Counseling Recommendations    1. Osteopenia, unspecified location Stable  bmd done in sep  - alendronate (FOSAMAX) 70 MG tablet; Take 1 tablet (70 mg total) by mouth every 7 (seven) days. Take with a full glass of water on an empty stomach.  Dispense: 12 tablet; Refill: 3  2. Estrogen  deficiency  - conjugated estrogens (PREMARIN) vaginal cream; INSERT 1/4 APPLICATORFUL VAGINALLY TWICEWEEKLY  Dispense: 30 g; Refill: 2  3. Preventative health care See above  - TSH - Lipid panel - CBC with Differential/Platelet - Comprehensive metabolic panel - VITAMIN D 25 Hydroxy (Vit-D Deficiency, Fractures)

## 2019-12-30 NOTE — Telephone Encounter (Signed)
Premarin 0.625mg /gm no longer covered by [employer]. Preferred alternatives: estradiol cream, Yuvafem, Vagifem 31mcg, and estrace cream. Please advise.

## 2019-12-31 ENCOUNTER — Other Ambulatory Visit: Payer: Self-pay | Admitting: Family Medicine

## 2019-12-31 DIAGNOSIS — E039 Hypothyroidism, unspecified: Secondary | ICD-10-CM

## 2019-12-31 DIAGNOSIS — E785 Hyperlipidemia, unspecified: Secondary | ICD-10-CM

## 2019-12-31 DIAGNOSIS — D729 Disorder of white blood cells, unspecified: Secondary | ICD-10-CM

## 2020-01-16 ENCOUNTER — Ambulatory Visit: Payer: BC Managed Care – PPO | Attending: Internal Medicine

## 2020-01-16 DIAGNOSIS — Z23 Encounter for immunization: Secondary | ICD-10-CM

## 2020-01-16 NOTE — Progress Notes (Signed)
   Covid-19 Vaccination Clinic  Name:  Kaitlyn Perez    MRN: [id number] DOB: 1955-11-26  01/16/2020  Kaitlyn Perez was observed post Covid-19 immunization for 15 minutes without incident. She was provided with Vaccine Information Sheet and instruction to access the V-Safe system.   Kaitlyn Perez was instructed to call 911 with any severe reactions post vaccine: Marland Kitchen Difficulty breathing  . Swelling of face and throat  . A fast heartbeat  . A bad rash all over body  . Dizziness and weakness   Immunizations Administered    Name Date Dose VIS Date Route   Pfizer COVID-19 Vaccine 01/16/2020  8:39 AM 0.3 mL 09/26/2019 Intramuscular   Manufacturer: [place]   Lot: [id number]   [place]: [id number]

## 2020-01-19 ENCOUNTER — Encounter: Payer: Self-pay | Admitting: Family Medicine

## 2020-01-22 ENCOUNTER — Telehealth: Payer: Self-pay | Admitting: Family Medicine

## 2020-01-22 NOTE — Telephone Encounter (Signed)
Patient called regarding a EOB she received from her insurance company about her lab work being  denied for payment . Patient states the coding was put in incorrectly by the provider . Patient states this issue has occurred before. Please advise.

## 2020-01-22 NOTE — Telephone Encounter (Signed)
Spoke with patient to let her know we are resubmitting charges under other diagnosis codes trying to get them covered for the patient.

## 2020-02-06 ENCOUNTER — Encounter: Payer: Self-pay | Admitting: Family Medicine

## 2020-02-06 DIAGNOSIS — Z780 Asymptomatic menopausal state: Secondary | ICD-10-CM

## 2020-02-06 NOTE — Telephone Encounter (Signed)
Pt ins contacted [place] and said her ins would not cover the premarin but they would cover the estradiol-------  We need to make sure we are letting pt know rx is changing before we change it  The instructions say 2 x a week

## 2020-02-09 ENCOUNTER — Ambulatory Visit: Payer: BC Managed Care – PPO | Attending: Internal Medicine

## 2020-02-09 DIAGNOSIS — Z23 Encounter for immunization: Secondary | ICD-10-CM

## 2020-02-09 MED ORDER — ESTRADIOL 0.1 MG/GM VA CREA: 1.0000 | TOPICAL_CREAM | VAGINAL | 12 refills | Status: DC

## 2020-02-09 NOTE — Progress Notes (Signed)
   Covid-19 Vaccination Clinic  Name:  Kaitlyn Perez    MRN: [id number] DOB: 04-17-1956  02/09/2020  Kaitlyn Perez was observed post Covid-19 immunization for 15 minutes without incident. She was provided with Vaccine Information Sheet and instruction to access the V-Safe system.   Kaitlyn Perez was instructed to call 911 with any severe reactions post vaccine: Marland Kitchen Difficulty breathing  . Swelling of face and throat  . A fast heartbeat  . A bad rash all over body  . Dizziness and weakness   Immunizations Administered    Name Date Dose VIS Date Route   Pfizer COVID-19 Vaccine 02/09/2020 11:19 AM 0.3 mL 12/10/2018 Intramuscular   Manufacturer: [place]   Lot: [id number]   [place]: [id number]

## 2020-02-09 NOTE — Telephone Encounter (Signed)
Message sent to pt and medication directions have been updated and re-sent to pharmacy.

## 2020-02-11 NOTE — Telephone Encounter (Signed)
Please advise 

## 2020-02-17 ENCOUNTER — Other Ambulatory Visit: Payer: Self-pay

## 2020-02-18 ENCOUNTER — Encounter: Payer: Self-pay | Admitting: Obstetrics and Gynecology

## 2020-02-18 ENCOUNTER — Ambulatory Visit: Payer: BC Managed Care – PPO | Admitting: Obstetrics and Gynecology

## 2020-02-18 VITALS — BP 128/76 | HR 68 | Temp 97.1°F | Resp 10 | Ht 64.75 in | Wt 134.0 lb

## 2020-02-18 DIAGNOSIS — Z780 Asymptomatic menopausal state: Secondary | ICD-10-CM | POA: Diagnosis not present

## 2020-02-18 DIAGNOSIS — Z01419 Encounter for gynecological examination (general) (routine) without abnormal findings: Secondary | ICD-10-CM

## 2020-02-18 MED ORDER — ESTRADIOL 0.1 MG/GM VA CREA: TOPICAL_CREAM | VAGINAL | 2 refills | Status: DC

## 2020-02-18 NOTE — Progress Notes (Signed)
64 y.o. G0P0000 Married Caucasian female here as a new patient for an annual exam. Patient would like to discuss having a "bump" inside the right labia and denies any pain, itching, or bleeding. Per patient, would like to discuss Premarin vs generic Estrace that her insurance is recommending.   She has vaginal dryness and painful intercourse.  She benefits from the Premarin cream.  She uses 0.5 grams cream twice a week, and this is successful for her.  She questioned using the whole applicator.   Denies any vaginal bleeding or spotting.   She is on Fosamax for her third year.   Worked at [employer]  Retired for 5 years.  She and her husband like to travel.   She received her last Covid vaccine.   PCP: Garnet Koyanagi Chase, DO    No LMP recorded (lmp unknown). Patient is postmenopausal.           Sexually active: Yes.    The current method of family planning is post menopausal status.    Exercising: No.  The patient does not participate in regular exercise at present. Smoker:  no  Health Maintenance: Pap:  12/26/18 Neg:Neg HR HPV done with PCP  01/10/16 Neg:Neg HR HPV History of abnormal Pap:  no MMG:  07/15/19 BIRADS 1 negative/density b Colonoscopy:  06/19/18 f/u 10 years BMD:   07/15/19  Result  Normal TDaP:  01/09/13 Gardasil:   n/a HIV: never Hep C: 01/10/16 Negative Screening Labs: PCP   reports that she has never smoked. She has never used smokeless tobacco. She reports previous alcohol use. She reports that she does not use drugs.  Past Medical History:  Diagnosis Date  . Allergy   . Cancer ([place]) 2006, 2020   basal call carcinoma - left upper side of thorax, right face  . Cataract    are beginning   . Hyperlipidemia   . Osteopenia   . Stress fracture 2019   foot    Past Surgical History:  Procedure Laterality Date  . BREAST BIOPSY Right   . BREAST CYST ASPIRATION    . COLONOSCOPY    . MANDIBLE SURGERY Bilateral 08/2012  . NASAL SINUS SURGERY  2001  .  POLYPECTOMY    . SKIN CANCER EXCISION    . TONSILLECTOMY AND ADENOIDECTOMY  1967    Current Outpatient Medications  Medication Sig Dispense Refill  . alendronate (FOSAMAX) 70 MG tablet Take 1 tablet (70 mg total) by mouth every 7 (seven) days. Take with a full glass of water on an empty stomach. 12 tablet 3  . Multiple Vitamins-Minerals (CENTRUM PO) Take 1 tablet by mouth daily.    . Multiple Vitamins-Minerals (PRESERVISION AREDS 2) CAPS     . Omega-3 Fatty Acids (FISH OIL PO) Take 2,000 mg by mouth daily.    Marland Kitchen estradiol (ESTRACE VAGINAL) 0.1 MG/GM vaginal cream Place 1/2 gram per vagina at hs two to three times weekly. 42.5 g 2   Current Facility-Administered Medications  Medication Dose Route Frequency Provider Last Rate Last Admin  . 0.9 %  sodium chloride infusion  500 mL Intravenous Once Nandigam, Venia Minks, MD        Family History  Problem Relation Age of Onset  . Arthritis Mother   . Hyperlipidemia Mother   . Hypotension Mother   . Cancer Mother 37       basal cell carcinoma  . Macular degeneration Mother   . Stroke Mother   . Thyroid disease Mother   .  Immunodeficiency Mother   . Colon cancer Paternal Uncle   . Hyperlipidemia Father   . Hypertension Father   . Diabetes Father   . Stroke Father   . Lung cancer Father   . Colon polyps Father   . Melanoma Father   . Heart disease Maternal Grandfather 14       Aortic aneurysm  . Heart disease Maternal Grandmother 51       Multiple heart attacks  . Skin cancer Brother   . Arthritis Brother   . Uterine cancer Paternal Aunt   . Breast cancer Maternal Aunt   . Breast cancer Other        maternal female cousin  . Lung cancer Paternal Uncle        x's 2 smokers  . Melanoma Paternal Uncle   . Stroke Paternal Uncle   . Heart Problems Paternal Uncle   . Heart disease Paternal Aunt   . Heart disease Maternal Aunt   . Heart disease Maternal Uncle   . Esophageal cancer Neg Hx   . Rectal cancer Neg Hx   . Stomach cancer  Neg Hx     Review of Systems  Constitutional: Negative.   HENT: Negative.   Eyes: Negative.   Respiratory: Negative.   Cardiovascular: Negative.   Gastrointestinal: Negative.   Endocrine: Negative.   Genitourinary: Negative.   Musculoskeletal: Negative.   Skin: Negative.   Allergic/Immunologic: Negative.   Neurological: Negative.   Hematological: Negative.   Psychiatric/Behavioral: Negative.     Exam:   BP 128/76 (BP Location: Right Arm, Patient Position: Sitting, Cuff Size: Normal)   Pulse 68   Temp (!) 97.1 F (36.2 C) (Temporal)   Resp 10   Ht 5' 4.75" (1.645 m)   Wt 134 lb (60.8 kg)   LMP  (LMP Unknown)   BMI 22.47 kg/m     General appearance: alert, cooperative and appears stated age Head: normocephalic, without obvious abnormality, atraumatic Neck: no adenopathy, supple, symmetrical, trachea midline and thyroid normal to inspection and palpation Lungs: clear to auscultation bilaterally Breasts: normal appearance, no masses or tenderness, No nipple retraction or dimpling, No nipple discharge or bleeding, No axillary adenopathy Heart: regular rate and rhythm Abdomen: soft, non-tender; no masses, no organomegaly Extremities: extremities normal, atraumatic, no cyanosis or edema Skin: skin color, texture, turgor normal. No rashes or lesions Lymph nodes: cervical, supraclavicular, and axillary nodes normal. Neurologic: grossly normal  Pelvic: External genitalia:   3 mm superficial cyst of the right labia majora/minora interface.              No abnormal inguinal nodes palpated.              Urethra:  normal appearing urethra with no masses, tenderness or lesions              Bartholins and Skenes: normal                 Vagina: normal appearing vagina with normal color and discharge, no lesions              Cervix: no lesions              Pap taken: No. Bimanual Exam:  Uterus:  normal size, contour, position, consistency, mobility, non-tender              Adnexa: no  mass, fullness, tenderness              Rectal exam: Yes.  Marland Kitchen  Confirms.              Anus:  normal sphincter tone, no lesions  Chaperone was present for exam.  Assessment:   Well woman visit with normal exam. Small right labial skin cyst.  Vaginal atrophy controlled on vaginal estrogen.  Normal bone density in 2020.  On Fosamax for previous osteopenia.  Hx stress fracture.  Hx breast cyst.   Plan: Mammogram screening discussed. Self breast awareness reviewed. Pap and HR HPV as above. Guidelines for Calcium, Vitamin D, regular exercise program including cardiovascular and weight bearing exercise. New Rx for Estradiol cream.  Dosage clarified.  I discussed potential effect on breast cancer.  Reassurance regarding vulvar cyst.  Follow up annually and prn.   After visit summary provided.

## 2020-02-18 NOTE — Patient Instructions (Signed)

## 2020-06-07 ENCOUNTER — Other Ambulatory Visit: Payer: Self-pay | Admitting: Family Medicine

## 2020-06-07 DIAGNOSIS — Z1231 Encounter for screening mammogram for malignant neoplasm of breast: Secondary | ICD-10-CM

## 2020-07-19 ENCOUNTER — Ambulatory Visit
Admission: RE | Admit: 2020-07-19 | Discharge: 2020-07-19 | Disposition: A | Discharge status: Home / Self Care | Payer: BC Managed Care – PPO | Source: Ambulatory Visit | Attending: Family Medicine | Admitting: Family Medicine

## 2020-07-19 ENCOUNTER — Other Ambulatory Visit: Payer: Self-pay

## 2020-07-19 DIAGNOSIS — Z1231 Encounter for screening mammogram for malignant neoplasm of breast: Secondary | ICD-10-CM

## 2020-08-12 ENCOUNTER — Ambulatory Visit (INDEPENDENT_AMBULATORY_CARE_PROVIDER_SITE_OTHER): Payer: BC Managed Care – PPO | Admitting: *Deleted

## 2020-08-12 ENCOUNTER — Other Ambulatory Visit: Payer: Self-pay

## 2020-08-12 DIAGNOSIS — Z23 Encounter for immunization: Secondary | ICD-10-CM | POA: Diagnosis not present

## 2020-08-12 NOTE — Progress Notes (Signed)
Patient here for flu vaccine.  Vaccine given in left deltoid and patient tolerated well. 

## 2021-01-03 ENCOUNTER — Encounter: Payer: Self-pay | Admitting: Family Medicine

## 2021-01-03 ENCOUNTER — Other Ambulatory Visit: Payer: Self-pay

## 2021-01-03 ENCOUNTER — Ambulatory Visit (INDEPENDENT_AMBULATORY_CARE_PROVIDER_SITE_OTHER): Payer: BC Managed Care – PPO | Admitting: Family Medicine

## 2021-01-03 VITALS — BP 124/80 | HR 51 | Temp 97.5°F | Resp 18 | Ht 64.75 in | Wt 137.6 lb

## 2021-01-03 DIAGNOSIS — M8580 Other specified disorders of bone density and structure, unspecified site: Secondary | ICD-10-CM | POA: Diagnosis not present

## 2021-01-03 DIAGNOSIS — L84 Corns and callosities: Secondary | ICD-10-CM

## 2021-01-03 DIAGNOSIS — Z0000 Encounter for general adult medical examination without abnormal findings: Secondary | ICD-10-CM

## 2021-01-03 DIAGNOSIS — E785 Hyperlipidemia, unspecified: Secondary | ICD-10-CM

## 2021-01-03 MED ORDER — ALENDRONATE SODIUM 70 MG PO TABS: 70.0000 mg | ORAL_TABLET | ORAL | 3 refills | Status: DC

## 2021-01-03 NOTE — Progress Notes (Signed)
Patient ID: Kaitlyn Perez, female    DOB: [birth year]  Age: 65 y.o. MRN: [id number]    Subjective:  Subjective  HPI Tihanna Goodson presents for a comprehensive physical exam today. She complains of having a callous on right bottom foot. She reports that it has progressively worsened and causes her pain. She notes that it is hard for her to walk on her right foot due to he pain. She states that she is feeling well today.SHe  She notes that she had seen her OBGYN last year. She plans to schedule a mammogram and dexa with her OBGYN. She also plans to receive a pneumonia vaccination. She denies any chest pain, SOB, fever, abdominal pain, cough, chills, sore throat, dysuria, urinary incontinence, back pain, HA, or N/VD at this time.    Fish oil, Multivitamin, and another medication mixed upsets her stomach which is why she stopped it. It has also affected her sleep patterns. She is asking what she could do to still gain her nutrients.   Review of Systems  Constitutional: Negative for chills, fatigue and fever.  HENT: Negative for congestion, ear pain, rhinorrhea, sinus pain and sore throat.   Eyes: Negative for pain.  Respiratory: Negative for cough and shortness of breath.   Cardiovascular: Negative for chest pain, palpitations and leg swelling.  Gastrointestinal: Negative for abdominal pain, blood in stool, diarrhea and nausea.  Genitourinary: Negative for dysuria, frequency, vaginal bleeding, vaginal discharge and vaginal pain.  Musculoskeletal: Negative for back pain and myalgias.  Skin: Negative for rash.  Allergic/Immunologic: Negative for environmental allergies.  Neurological: Negative for dizziness and headaches.    History Past Medical History:  Diagnosis Date  . Allergy   . Cancer ([place]) 2006, 2020   basal call carcinoma - left upper side of thorax, right face  . Cataract    are beginning   . Hyperlipidemia   . Osteopenia   . Stress fracture 2019   foot    She has a past surgical  history that includes Nasal sinus surgery (2001); Tonsillectomy and adenoidectomy (1967); Mandible surgery (Bilateral, 08/2012); Colonoscopy; Polypectomy; Breast biopsy (Right); Breast cyst aspiration; and Skin cancer excision.   Her family history includes Arthritis in her brother and mother; Breast cancer in her maternal aunt and another family member; Cancer (age of onset: 77) in her mother; Colon cancer in her paternal uncle; Colon polyps in her father; Diabetes in her father; Heart Problems in her paternal uncle; Heart disease in her maternal aunt, maternal uncle, and paternal aunt; Heart disease (age of onset: 65) in her maternal grandfather; Heart disease (age of onset: 29) in her maternal grandmother; Hyperlipidemia in her father and mother; Hypertension in her father; Hypotension in her mother; Immunodeficiency in her mother; Lung cancer in her father and paternal uncle; Macular degeneration in her mother; Melanoma in her father and paternal uncle; Skin cancer in her brother; Stroke in her father, mother, and paternal uncle; Thyroid disease in her mother; Uterine cancer in her paternal aunt.She reports that she has never smoked. She has never used smokeless tobacco. She reports previous alcohol use. She reports that she does not use drugs.  Current Outpatient Medications on File Prior to Visit  Medication Sig Dispense Refill  . estradiol (ESTRACE VAGINAL) 0.1 MG/GM vaginal cream Place 1/2 gram per vagina at hs two to three times weekly. (Patient not taking: Reported on 01/03/2021) 42.5 g 2   Current Facility-Administered Medications on File Prior to Visit  Medication Dose Route Frequency Provider Last Rate  Last Admin  . 0.9 %  sodium chloride infusion  500 mL Intravenous Once Nandigam, Venia Minks, MD         Objective:  Objective  Physical Exam Vitals and nursing note reviewed.  Constitutional:      General: She is not in acute distress.    Appearance: Normal appearance. She is  well-developed. She is not ill-appearing.  HENT:     Head: Normocephalic and atraumatic.     Right Ear: Tympanic membrane, ear canal and external ear normal.     Left Ear: Tympanic membrane, ear canal and external ear normal.     Nose: Nose normal.  Eyes:     General:        Right eye: No discharge.        Left eye: No discharge.     Extraocular Movements: Extraocular movements intact.     Pupils: Pupils are equal, round, and reactive to light.  Cardiovascular:     Rate and Rhythm: Normal rate and regular rhythm.     Pulses: Normal pulses.     Heart sounds: Normal heart sounds. No murmur heard.   Pulmonary:     Effort: Pulmonary effort is normal. No respiratory distress.     Breath sounds: Normal breath sounds. No wheezing, rhonchi or rales.  Abdominal:     General: Bowel sounds are normal. There is no distension.     Palpations: Abdomen is soft. There is no mass.     Tenderness: There is no abdominal tenderness.  Musculoskeletal:     Cervical back: Normal range of motion and neck supple.  Feet:     Right foot:     Skin integrity: Callus (on 1st metatarsal aspect) present.  Skin:    General: Skin is warm and dry.  Neurological:     Mental Status: She is alert and oriented to person, place, and time.  Psychiatric:        Behavior: Behavior normal.    BP 124/80 (BP Location: Right Arm, Patient Position: Sitting, Cuff Size: Normal)   Pulse (!) 51   Temp (!) 97.5 F (36.4 C) (Oral)   Resp 18   Ht 5' 4.75" (1.645 m)   Wt 137 lb 9.6 oz (62.4 kg)   LMP  (LMP Unknown)   SpO2 97%   BMI 23.07 kg/m  Wt Readings from Last 3 Encounters:  01/03/21 137 lb 9.6 oz (62.4 kg)  02/18/20 134 lb (60.8 kg)  12/30/19 135 lb 6.4 oz (61.4 kg)     Lab Results  Component Value Date   WBC 3.5 (L) 12/30/2019   HGB 13.7 12/30/2019   HCT 41.3 12/30/2019   PLT 273.0 12/30/2019   GLUCOSE 91 12/30/2019   CHOL 308 (H) 12/30/2019   TRIG 102.0 12/30/2019   HDL 89.50 12/30/2019   LDLDIRECT  152.7 01/09/2013   LDLCALC 198 (H) 12/30/2019   ALT 12 12/30/2019   AST 15 12/30/2019   NA 141 12/30/2019   K 3.8 12/30/2019   CL 105 12/30/2019   CREATININE 0.70 12/30/2019   BUN 13 12/30/2019   CO2 30 12/30/2019   TSH 4.90 (H) 12/30/2019    MM 3D SCREEN BREAST BILATERAL  Result Date: 07/21/2020 CLINICAL DATA:  Screening. EXAM: DIGITAL SCREENING BILATERAL MAMMOGRAM WITH TOMO AND CAD COMPARISON:  Previous exam(s). ACR Breast Density Category b: There are scattered areas of fibroglandular density. FINDINGS: There are no findings suspicious for malignancy. Images were processed with CAD. IMPRESSION: No mammographic evidence of malignancy.  A result letter of this screening mammogram will be mailed directly to the patient. RECOMMENDATION: Screening mammogram in one year. (Code:SM-B-01Y) BI-RADS CATEGORY  1: Negative. Electronically Signed   By: Lillia Mountain M.D.   On: 07/21/2020 15:31     Assessment & Plan:  Plan    Meds ordered this encounter  Medications  . alendronate (FOSAMAX) 70 MG tablet    Sig: Take 1 tablet (70 mg total) by mouth every 7 (seven) days. Take with a full glass of water on an empty stomach.    Dispense:  12 tablet    Refill:  3    Problem List Items Addressed This Visit      Unprioritized   Osteopenia   Relevant Medications   alendronate (FOSAMAX) 70 MG tablet   Pre-ulcerative corn or callous    ? Plantar wart Refer to podiatry      Relevant Orders   Ambulatory referral to Podiatry   Preventative health care - Primary    ghm utd Check labs  See avs      Relevant Orders   Lipid panel   CBC with Differential/Platelet   TSH   Comprehensive metabolic panel    Colonoscopy Last completed on 06/20/2011, plyops, diverticulitis, and internal hemorrhoids noted , repeat in 5-10 years  Dexa-Last completed on 07/15/2019, results were normal, repeat in 2 years  Pap Smear-Last completed 12/26/2018, results were normal, repeat in 1 year  Mammo-Last  completed on 07/19/2020, results were normal, repeat in 1 year    Follow-up: Return in about 3 months (around 04/05/2021), or if symptoms worsen or fail to improve.   I,Gordon Zheng,acting as a Education administrator for [employer], DO.,have documented all relevant documentation on the behalf of Ann Held, DO,as directed by  Ann Held, DO while in the presence of [place], DO, have reviewed all documentation for this visit. The documentation on 01/03/21 for the exam, diagnosis, procedures, and orders are all accurate and complete. Ann Held, DO

## 2021-01-03 NOTE — Assessment & Plan Note (Signed)
?   Plantar wart Refer to podiatry

## 2021-01-03 NOTE — Patient Instructions (Signed)
Preventive Care 65-65 Years Old, Female Preventive care refers to lifestyle choices and visits with your health care provider that can promote health and wellness. This includes:  A yearly physical exam. This is also called an annual wellness visit.  Regular dental and eye exams.  Immunizations.  Screening for certain conditions.  Healthy lifestyle choices, such as: ? Eating a healthy diet. ? Getting regular exercise. ? Not using drugs or products that contain nicotine and tobacco. ? Limiting alcohol use. What can I expect for my preventive care visit? Physical exam Your health care provider will check your:  Height and weight. These may be used to calculate your BMI (body mass index). BMI is a measurement that tells if you are at a healthy weight.  Heart rate and blood pressure.  Body temperature.  Skin for abnormal spots. Counseling Your health care provider may ask you questions about your:  Past medical problems.  Family's medical history.  Alcohol, tobacco, and drug use.  Emotional well-being.  Home life and relationship well-being.  Sexual activity.  Diet, exercise, and sleep habits.  Work and work Statistician.  Access to firearms.  Method of birth control.  Menstrual cycle.  Pregnancy history. What immunizations do I need? Vaccines are usually given at various ages, according to a schedule. Your health care provider will recommend vaccines for you based on your age, medical history, and lifestyle or other factors, such as travel or where you work.   What tests do I need? Blood tests  Lipid and cholesterol levels. These may be checked every 5 years, or more often if you are over 65 years old.  Hepatitis C test.  Hepatitis B test. Screening  Lung cancer screening. You may have this screening every year starting at age 65 if you have a 30-pack-year history of smoking and currently smoke or have quit within the past 15 years.  Colorectal cancer  screening. ? All adults should have this screening starting at age 65 and continuing until age 17. ? Your health care provider may recommend screening at age 65 if you are at increased risk. ? You will have tests every 1-10 years, depending on your results and the type of screening test.  Diabetes screening. ? This is done by checking your blood sugar (glucose) after you have not eaten for a while (fasting). ? You may have this done every 1-3 years.  Mammogram. ? This may be done every 1-2 years. ? Talk with your health care provider about when you should start having regular mammograms. This may depend on whether you have a family history of breast cancer.  BRCA-related cancer screening. This may be done if you have a family history of breast, ovarian, tubal, or peritoneal cancers.  Pelvic exam and Pap test. ? This may be done every 3 years starting at age 10. ? Starting at age 11, this may be done every 5 years if you have a Pap test in combination with an HPV test. Other tests  STD (sexually transmitted disease) testing, if you are at risk.  Bone density scan. This is done to screen for osteoporosis. You may have this scan if you are at high risk for osteoporosis. Talk with your health care provider about your test results, treatment options, and if necessary, the need for more tests. Follow these instructions at home: Eating and drinking  Eat a diet that includes fresh fruits and vegetables, whole grains, lean protein, and low-fat dairy products.  Take vitamin and mineral supplements  as recommended by your health care provider.  Do not drink alcohol if: ? Your health care provider tells you not to drink. ? You are pregnant, may be pregnant, or are planning to become pregnant.  If you drink alcohol: ? Limit how much you have to 0-1 drink a day. ? Be aware of how much alcohol is in your drink. In the [place] one drink equals one 12 oz bottle of beer (355 mL), one 5 oz glass of  wine (148 mL), or one 1 oz glass of hard liquor (44 mL).   Lifestyle  Take daily care of your teeth and gums. Brush your teeth every morning and night with fluoride toothpaste. Floss one time each day.  Stay active. Exercise for at least 30 minutes 5 or more days each week.  Do not use any products that contain nicotine or tobacco, such as cigarettes, e-cigarettes, and chewing tobacco. If you need help quitting, ask your health care provider.  Do not use drugs.  If you are sexually active, practice safe sex. Use a condom or other form of protection to prevent STIs (sexually transmitted infections).  If you do not wish to become pregnant, use a form of birth control. If you plan to become pregnant, see your health care provider for a prepregnancy visit.  If told by your health care provider, take low-dose aspirin daily starting at age 50.  Find healthy ways to cope with stress, such as: ? Meditation, yoga, or listening to music. ? Journaling. ? Talking to a trusted person. ? Spending time with friends and family. Safety  Always wear your seat belt while driving or riding in a vehicle.  Do not drive: ? If you have been drinking alcohol. Do not ride with someone who has been drinking. ? When you are tired or distracted. ? While texting.  Wear a helmet and other protective equipment during sports activities.  If you have firearms in your house, make sure you follow all gun safety procedures. What's next?  Visit your health care provider once a year for an annual wellness visit.  Ask your health care provider how often you should have your eyes and teeth checked.  Stay up to date on all vaccines. This information is not intended to replace advice given to you by your health care provider. Make sure you discuss any questions you have with your health care provider. Document Revised: 07/06/2020 Document Reviewed: 06/13/2018 Elsevier Patient Education  2021 Elsevier Inc.  

## 2021-01-03 NOTE — Assessment & Plan Note (Signed)
ghm utd Check labs  See avs  

## 2021-01-04 LAB — COMPREHENSIVE METABOLIC PANEL
ALT: 12 U/L (ref 0–35)
AST: 15 U/L (ref 0–37)
Albumin: 4.7 g/dL (ref 3.5–5.2)
Alkaline Phosphatase: 39 U/L (ref 39–117)
BUN: 19 mg/dL (ref 6–23)
CO2: 29 mEq/L (ref 19–32)
Calcium: 10.1 mg/dL (ref 8.4–10.5)
Chloride: 104 mEq/L (ref 96–112)
Creatinine, Ser: 0.71 mg/dL (ref 0.40–1.20)
GFR: 89.76 mL/min (ref >60.00)
Glucose, Bld: 88 mg/dL (ref 70–99)
Potassium: 4.2 mEq/L (ref 3.5–5.1)
Sodium: 142 mEq/L (ref 135–145)
Total Bilirubin: 0.6 mg/dL (ref 0.2–1.2)
Total Protein: 6.9 g/dL (ref 6.0–8.3)

## 2021-01-04 LAB — LIPID PANEL
Cholesterol: 331 mg/dL — ABNORMAL HIGH (ref 0–200)
HDL: 95.9 mg/dL (ref >39.00)
LDL Cholesterol: 219 mg/dL — ABNORMAL HIGH (ref 0–99)
NonHDL: 234.77
Total CHOL/HDL Ratio: 3
Triglycerides: 81 mg/dL (ref 0.0–149.0)
VLDL: 16.2 mg/dL (ref 0.0–40.0)

## 2021-01-04 LAB — CBC WITH DIFFERENTIAL/PLATELET
Basophils Absolute: 0.1 10*3/uL (ref 0.0–0.1)
Basophils Relative: 1.2 % (ref 0.0–3.0)
Eosinophils Absolute: 0.1 10*3/uL (ref 0.0–0.7)
Eosinophils Relative: 1.2 % (ref 0.0–5.0)
HCT: 42 % (ref 36.0–46.0)
Hemoglobin: 14 g/dL (ref 12.0–15.0)
Lymphocytes Relative: 27.3 % (ref 12.0–46.0)
Lymphs Abs: 1.1 10*3/uL (ref 0.7–4.0)
MCHC: 33.5 g/dL (ref 30.0–36.0)
MCV: 95.1 fl (ref 78.0–100.0)
Monocytes Absolute: 0.5 10*3/uL (ref 0.1–1.0)
Monocytes Relative: 11.7 % (ref 3.0–12.0)
Neutro Abs: 2.5 10*3/uL (ref 1.4–7.7)
Neutrophils Relative %: 58.6 % (ref 43.0–77.0)
Platelets: 273 10*3/uL (ref 150.0–400.0)
RBC: 4.41 Mil/uL (ref 3.87–5.11)
RDW: 13.8 % (ref 11.5–15.5)
WBC: 4.2 10*3/uL (ref 4.0–10.5)

## 2021-01-04 LAB — TSH: TSH: 3.49 u[IU]/mL (ref 0.35–4.50)

## 2021-01-05 ENCOUNTER — Other Ambulatory Visit: Payer: Self-pay | Admitting: Family Medicine

## 2021-01-05 DIAGNOSIS — E785 Hyperlipidemia, unspecified: Secondary | ICD-10-CM

## 2021-01-07 ENCOUNTER — Encounter: Payer: Self-pay | Admitting: Family Medicine

## 2021-01-07 ENCOUNTER — Other Ambulatory Visit: Payer: Self-pay | Admitting: Family Medicine

## 2021-01-07 MED ORDER — ROSUVASTATIN CALCIUM 10 MG PO TABS: 10.0000 mg | ORAL_TABLET | Freq: Every day | ORAL | 2 refills | Status: DC

## 2021-01-07 MED ORDER — ROSUVASTATIN CALCIUM 10 MG PO TABS: 10.0000 mg | ORAL_TABLET | Freq: Every day | ORAL | 3 refills | Status: DC

## 2021-01-07 NOTE — Telephone Encounter (Signed)
I sent it in but its ok to send meds in if pt agrees

## 2021-01-17 ENCOUNTER — Ambulatory Visit: Payer: BC Managed Care – PPO | Admitting: Podiatry

## 2021-01-17 ENCOUNTER — Encounter: Payer: Self-pay | Admitting: Podiatry

## 2021-01-17 ENCOUNTER — Other Ambulatory Visit: Payer: Self-pay

## 2021-01-17 DIAGNOSIS — D2370 Other benign neoplasm of skin of unspecified lower limb, including hip: Secondary | ICD-10-CM | POA: Diagnosis not present

## 2021-01-17 NOTE — Progress Notes (Signed)
  Subjective:  Patient ID: Kaitlyn Perez, female    DOB: 03/29/1956,  MRN: [id number]  Chief Complaint  Patient presents with  . Callouses    Pre-ulcerative corn or callous bilateral first submet    65 y.o. female presents with the above complaint. History confirmed with patient.  She is unsure if these were warts, there is what her PCP thought it might be  Objective:  Physical Exam: warm, good capillary refill, no trophic changes or ulcerative lesions, normal DP and PT pulses and normal sensory exam.  Bilaterally she has benign-appearing central nucleated skin lesion submetatarsal 1 which are painful on palpation.  No petechial bleeding on debridement Assessment:   1. Benign neoplasm of skin of lower extremity, unspecified laterality      Plan:  Patient was evaluated and treated and all questions answered.  Discussed etiology and treatment of such lesions in detail with the patient as well as multiple treatment options including debridement and chemical treatment with salicylic acid.  We also discussed offloading the lesions with padding and possibly orthotic devices if this does not improve.  Recommended she use salicylic acid at home.  Following debridement of the lesions with a chisel blade I destroyed them using salicylic acid salinocaine ointment and covered a Band-Aid.  She will remove this in 24 hours. Return if symptoms worsen or fail to improve.

## 2021-01-17 NOTE — Patient Instructions (Signed)
Look for Salicylic acid 84% strength to use daily on the lesion with a file or pumice stone. Consider having custom foot orthotics made for offloading the lesion

## 2021-02-01 NOTE — Progress Notes (Signed)
Thank you, sorry about that

## 2021-03-04 ENCOUNTER — Encounter: Payer: Self-pay | Admitting: Family Medicine

## 2021-03-04 NOTE — Telephone Encounter (Signed)
Okay to order?

## 2021-03-04 NOTE — Telephone Encounter (Signed)
I have never seen that with crestor or any other statin--- we would need to see her to try to figure it out  If it comes back she should come in

## 2021-03-04 NOTE — Telephone Encounter (Signed)
Yes

## 2021-03-04 NOTE — Telephone Encounter (Signed)
Let try lipitor 10 mg 3 days a week ----- MWF

## 2021-03-07 NOTE — Progress Notes (Signed)
65 y.o. G61P0000 Married Caucasian female here for annual exam.    Patient couldn't tolerate Crestor. PCP is going to try something else.  Denies vaginal bleeding, bladder control, or bowel problems.  Not using her vaginal estrogen cream.   Helping her mother in [place], [place].  Hard to to exercise regularly.  Very active in the yard and house.  Enjoyed a recent hike.   Received her Covid booster.   PCP:  Roma Schanz, DO   Patient's last menstrual period was 10/17/2003 (approximate).           Sexually active: No. husband with "issues" The current method of family planning is post menopausal status.    Exercising: No.  The patient does not participate in regular exercise at present. Smoker:  no  Health Maintenance: Pap: 12-26-18 Neg:Neg HR HPV, 01-10-16 Neg:Neg HR HPV, 11-14-11 Neg History of abnormal Pap:  no MMG:  07-19-20 3D/Neg/BiRads1 Colonoscopy: 06/19/18 f/u 10 years BMD: 07-15-19  Result Normal TDaP:  01-09-13 Gardasil:   no HIV:no Hep C: 01-10-16 Neg Screening Labs:   PCP.    reports that she has never smoked. She has never used smokeless tobacco. She reports previous alcohol use. She reports that she does not use drugs.  Past Medical History:  Diagnosis Date  . Allergy   . Cancer ([place]) 2006, 2020   basal call carcinoma - left upper side of thorax, right face  . Cataract    are beginning   . Hyperlipidemia   . Osteopenia   . Stress fracture 2019   foot    Past Surgical History:  Procedure Laterality Date  . BREAST BIOPSY Right   . BREAST CYST ASPIRATION    . COLONOSCOPY    . MANDIBLE SURGERY Bilateral 08/2012  . NASAL SINUS SURGERY  2001  . POLYPECTOMY    . SKIN CANCER EXCISION    . TONSILLECTOMY AND ADENOIDECTOMY  1967    Current Outpatient Medications  Medication Sig Dispense Refill  . alendronate (FOSAMAX) 70 MG tablet Take 1 tablet (70 mg total) by mouth every 7 (seven) days. Take with a full glass of water on an empty stomach. 12 tablet 3  .  metroNIDAZOLE (METROCREAM) 0.75 % cream Apply topically 2 (two) times daily.     Current Facility-Administered Medications  Medication Dose Route Frequency Provider Last Rate Last Admin  . 0.9 %  sodium chloride infusion  500 mL Intravenous Once Nandigam, Venia Minks, MD        Family History  Problem Relation Age of Onset  . Arthritis Mother   . Hyperlipidemia Mother   . Hypotension Mother   . Cancer Mother 70       basal cell carcinoma  . Macular degeneration Mother   . Stroke Mother   . Thyroid disease Mother   . Immunodeficiency Mother   . Colon cancer Paternal Uncle   . Hyperlipidemia Father   . Hypertension Father   . Diabetes Father   . Stroke Father   . Lung cancer Father   . Colon polyps Father   . Melanoma Father   . Heart disease Maternal Grandfather 45       Aortic aneurysm  . Heart disease Maternal Grandmother 77       Multiple heart attacks  . Skin cancer Brother   . Arthritis Brother   . Uterine cancer Paternal Aunt   . Breast cancer Maternal Aunt   . Breast cancer Other        maternal  female cousin  . Lung cancer Paternal Uncle        x's 2 smokers  . Melanoma Paternal Uncle   . Stroke Paternal Uncle   . Heart Problems Paternal Uncle   . Heart disease Paternal Aunt   . Heart disease Maternal Aunt   . Heart disease Maternal Uncle   . Esophageal cancer Neg Hx   . Rectal cancer Neg Hx   . Stomach cancer Neg Hx     Review of Systems  All other systems reviewed and are negative.   Exam:   BP 130/90   Pulse 61   Ht 5' 3.5" (1.613 m)   Wt 141 lb (64 kg)   LMP 10/17/2003 (Approximate)   SpO2 99%   BMI 24.59 kg/m     General appearance: alert, cooperative and appears stated age Head: normocephalic, without obvious abnormality, atraumatic Neck: no adenopathy, supple, symmetrical, trachea midline and thyroid normal to inspection and palpation Lungs: clear to auscultation bilaterally Breasts: normal appearance, no masses or tenderness, No nipple  retraction or dimpling, No nipple discharge or bleeding, No axillary adenopathy Heart: regular rate and rhythm Abdomen: soft, non-tender; no masses, no organomegaly Extremities: extremities normal, atraumatic, no cyanosis or edema Skin: skin color, texture, turgor normal. No rashes or lesions Lymph nodes: cervical, supraclavicular, and axillary nodes normal. Neurologic: grossly normal  Pelvic: External genitalia:  no lesions              No abnormal inguinal nodes palpated.              Urethra:  normal appearing urethra with no masses, tenderness or lesions              Bartholins and Skenes: normal                 Vagina: erythema of vaginal mucosa and orange discharge, no lesions              Cervix: no lesions              Pap taken: No. Bimanual Exam:  Uterus:  normal size, contour, position, consistency, mobility, non-tender              Adnexa: no mass, fullness, tenderness              Rectal exam: Yes.  .  Confirms.              Anus:  normal sphincter tone, no lesions  Chaperone was present for exam.  Assessment:   Well woman visit with normal exam.  Vaginal atrophy.  Off Estrace cream. Normal bone density in 2020.  On Fosamax through PCP for previous osteopenia.  Hx stress fracture.  Hx breast cyst.  Elevated cholesterol.   Plan: Mammogram screening discussed. Self breast awareness reviewed. Pap and HR HPV as above. Guidelines for Calcium, Vitamin D, regular exercise program including cardiovascular and weight bearing exercise. Restart vaginal estradiol cream.  Instructed in use.  I discussed potential effect on breast cancer.  BMD due this fall.  Labs with PCP.  We discussed Covid boosters.  Follow up annually and prn.

## 2021-03-08 ENCOUNTER — Other Ambulatory Visit: Payer: Self-pay

## 2021-03-08 ENCOUNTER — Encounter: Payer: Self-pay | Admitting: Family Medicine

## 2021-03-08 DIAGNOSIS — Z1231 Encounter for screening mammogram for malignant neoplasm of breast: Secondary | ICD-10-CM

## 2021-03-08 DIAGNOSIS — Z13820 Encounter for screening for osteoporosis: Secondary | ICD-10-CM

## 2021-03-09 ENCOUNTER — Other Ambulatory Visit: Payer: Self-pay

## 2021-03-09 ENCOUNTER — Encounter: Payer: Self-pay | Admitting: Obstetrics and Gynecology

## 2021-03-09 ENCOUNTER — Ambulatory Visit (INDEPENDENT_AMBULATORY_CARE_PROVIDER_SITE_OTHER): Payer: BC Managed Care – PPO | Admitting: Obstetrics and Gynecology

## 2021-03-09 VITALS — BP 130/90 | HR 61 | Ht 63.5 in | Wt 141.0 lb

## 2021-03-09 DIAGNOSIS — Z01419 Encounter for gynecological examination (general) (routine) without abnormal findings: Secondary | ICD-10-CM

## 2021-03-09 DIAGNOSIS — N952 Postmenopausal atrophic vaginitis: Secondary | ICD-10-CM | POA: Diagnosis not present

## 2021-03-09 MED ORDER — ESTRADIOL 0.1 MG/GM VA CREA: TOPICAL_CREAM | VAGINAL | 1 refills | Status: DC

## 2021-03-09 NOTE — Patient Instructions (Signed)

## 2021-03-10 ENCOUNTER — Encounter: Payer: Self-pay | Admitting: Family Medicine

## 2021-03-10 ENCOUNTER — Other Ambulatory Visit: Payer: Self-pay

## 2021-03-10 MED ORDER — ATORVASTATIN CALCIUM 10 MG PO TABS: ORAL_TABLET | ORAL | 2 refills | Status: DC

## 2021-03-21 ENCOUNTER — Ambulatory Visit: Payer: BC Managed Care – PPO | Admitting: Family Medicine

## 2021-04-19 ENCOUNTER — Encounter: Payer: Self-pay | Admitting: Family Medicine

## 2021-04-21 ENCOUNTER — Other Ambulatory Visit: Payer: Self-pay

## 2021-04-21 ENCOUNTER — Other Ambulatory Visit (INDEPENDENT_AMBULATORY_CARE_PROVIDER_SITE_OTHER): Payer: BC Managed Care – PPO

## 2021-04-21 DIAGNOSIS — E785 Hyperlipidemia, unspecified: Secondary | ICD-10-CM

## 2021-04-21 LAB — COMPREHENSIVE METABOLIC PANEL
ALT: 16 U/L (ref 0–35)
AST: 18 U/L (ref 0–37)
Albumin: 4.4 g/dL (ref 3.5–5.2)
Alkaline Phosphatase: 37 U/L — ABNORMAL LOW (ref 39–117)
BUN: 13 mg/dL (ref 6–23)
CO2: 28 mEq/L (ref 19–32)
Calcium: 9.6 mg/dL (ref 8.4–10.5)
Chloride: 107 mEq/L (ref 96–112)
Creatinine, Ser: 0.73 mg/dL (ref 0.40–1.20)
GFR: 86.64 mL/min (ref >60.00)
Glucose, Bld: 84 mg/dL (ref 70–99)
Potassium: 4.5 mEq/L (ref 3.5–5.1)
Sodium: 144 mEq/L (ref 135–145)
Total Bilirubin: 0.6 mg/dL (ref 0.2–1.2)
Total Protein: 6.6 g/dL (ref 6.0–8.3)

## 2021-04-21 LAB — LIPID PANEL
Cholesterol: 208 mg/dL — ABNORMAL HIGH (ref 0–200)
HDL: 91.8 mg/dL (ref >39.00)
LDL Cholesterol: 101 mg/dL — ABNORMAL HIGH (ref 0–99)
NonHDL: 116.46
Total CHOL/HDL Ratio: 2
Triglycerides: 75 mg/dL (ref 0.0–149.0)
VLDL: 15 mg/dL (ref 0.0–40.0)

## 2021-04-22 ENCOUNTER — Encounter: Payer: Self-pay | Admitting: Family Medicine

## 2021-04-22 ENCOUNTER — Other Ambulatory Visit: Payer: Self-pay | Admitting: Family Medicine

## 2021-04-22 DIAGNOSIS — E785 Hyperlipidemia, unspecified: Secondary | ICD-10-CM

## 2021-04-22 MED ORDER — ATORVASTATIN CALCIUM 20 MG PO TABS: 20.0000 mg | ORAL_TABLET | Freq: Every day | ORAL | 3 refills | Status: DC

## 2021-04-22 NOTE — Addendum Note (Signed)
Addended by: Jeronimo Greaves on: 04/22/2021 02:32 PM   Modules accepted: Orders

## 2021-05-04 NOTE — Telephone Encounter (Signed)
She is willing to do the 20mg  but can she do it 3 times a week?

## 2021-05-12 ENCOUNTER — Encounter: Payer: Self-pay | Admitting: Family Medicine

## 2021-05-26 ENCOUNTER — Other Ambulatory Visit: Payer: Self-pay

## 2021-05-26 MED ORDER — PRAVASTATIN SODIUM 20 MG PO TABS: 20.0000 mg | ORAL_TABLET | Freq: Every day | ORAL | 2 refills | Status: DC

## 2021-05-30 ENCOUNTER — Other Ambulatory Visit: Payer: Self-pay

## 2021-05-30 ENCOUNTER — Encounter: Payer: Self-pay | Admitting: Family Medicine

## 2021-05-30 ENCOUNTER — Ambulatory Visit: Payer: Medicare PPO | Admitting: Family Medicine

## 2021-05-30 VITALS — BP 120/82 | HR 55 | Temp 98.0°F | Resp 18 | Ht 63.5 in | Wt 142.2 lb

## 2021-05-30 DIAGNOSIS — E785 Hyperlipidemia, unspecified: Secondary | ICD-10-CM | POA: Diagnosis not present

## 2021-05-30 DIAGNOSIS — R202 Paresthesia of skin: Secondary | ICD-10-CM

## 2021-05-30 DIAGNOSIS — R200 Anesthesia of skin: Secondary | ICD-10-CM | POA: Diagnosis not present

## 2021-05-30 NOTE — Assessment & Plan Note (Signed)
Hold statin for 1 more week We will try pravachol --- she will call if there is a problem She is willing to go back on lipitor 10 mg ---she did not have any symptoms on that dose

## 2021-05-30 NOTE — Patient Instructions (Signed)
Statin Intolerance Statin intolerance is the inability to take a certain type of cholesterol-lowering medicine (statin) because of unwanted side effects, such as muscle pain. Statins may be prescribed to improve cholesterol levels and lower the risk for heart attack, heart disease, and stroke. People with statin intolerance may experience muscle pain and cramps (myalgia) that go away when the statin is stopped. What are the causes? The cause of this condition is not known. What increases the risk? You may be at higher risk for statin intolerance if you: Take more than one type of cholesterol-lowering medicine at a time. Need a higher than normal dosage of a statin. Have a history of high CK (creatine kinase) in the blood. CK is an enzyme that is released when muscle tissue is damaged. Are a woman. Have a body size that is smaller than normal. Are age 67 or older. Drink a lot of alcohol. Are of Asian descent. Have kidney, liver, or muscle disease. Have a low level of hormones that control how your body uses energy (hypothyroidism). Take certain medicines, including: Certain medicines for mental illness (antipsychotics). Some types of antibiotics. Certain medicines used for blood pressure or heart disease. Medicines that reduce the activity of the body's disease-fighting system (immunosuppressants). Medicines to treat hepatitis C and HIV/AIDS (human immunodeficiency virus/acquired immunodeficiency syndrome). Follow an intense exercise program. Drink a lot of grapefruit juice. Have a lack of vitamin D (deficiency). What are the signs or symptoms? Signs and symptoms of statin intolerance include: General muscle aches (myalgia). This may feel similar to muscle aches that are caused by the flu. Muscle pain, tenderness, cramps, or weakness (myositis). Severe muscle pain, weakness, and raised blood CK levels (rhabdomyolysis). Symptoms usually go away when the statin is stopped. Rarely, liver  damage can also occur, which can cause: Loss of appetite. Pain in the upper right abdomen. Yellowing of the skin or the white parts of the eyes (jaundice). How is this diagnosed? This condition is diagnosed based on your symptoms, your medical history, and aphysical exam. You may also have blood tests. How is this treated? Your health care provider may have you stop taking the statin for a short time to see if your symptoms go away. Then your provider may restart your statin, or: Change you to a different statin. Lower the dosage of your statin. Have you take your statin less often. Change you to another type of cholesterol-lowering medicine. Stop or change any medicines that might be interfering with your statin. Limit how much grapefruit juice you drink. Recommend stopping intense exercise. Follow these instructions at home: Medicines Take your statin medicine as told by your health care provider. Do not stop taking the statin unless your health care provider tells you to stop. Take other over-the-counter and prescription medicines only as told by your health care provider. Check with your health care provider before taking any new medicines. Certain medicines can increase your risk for statin intolerance. Lifestyle Limit alcohol intake to no more than 1 drink a day for nonpregnant women and 2 drinks a day for men. One drink equals 12 oz of beer, 5 oz of wine, or 1 oz of hard liquor. Do not use any products that contain nicotine or tobacco, such as cigarettes and e-cigarettes. If you need help quitting, ask your health care provider. General instructions  Have blood tests to check CK levels or liver enzymes as told by your health care provider. Exercise as directed. Ask your health care provider what exercises are best  for you. Do not start a new exercise program before talking about it with your health care provider. Follow instructions from your health care provider about eating or  drinking restrictions. Your health care provider may recommend: Limiting the amount of grapefruit juice you drink, or not drinking it at all. Eating a diet that is low in saturated fats and high in fiber. Maintain a healthy weight with diet and exercise. Keep all follow-up visits as told by your health care provider. This is important.  Contact a health care provider if: You have any symptoms of statin intolerance. Summary Statins are important medicines for improving your cholesterol, which may reduce your risk for heart attack and stroke. Some people are not able to continue taking a particular statin because of muscle problems (myalgia) or other side effects. Myalgia is the most common symptom of statin intolerance. Often, the muscle pain and cramps from myalgia go away when the statin is stopped. Although rare, liver damage can occur as a result of statin intolerance. You should have routine blood tests to check your liver enzymes. In most cases, statin intolerance can be managed and you can continue to take a cholesterol-lowering medicine. This information is not intended to replace advice given to you by your health care provider. Make sure you discuss any questions you have with your healthcare provider. Document Revised: 09/29/2020 Document Reviewed: 01/23/2020 Elsevier Patient Education  [place].

## 2021-05-30 NOTE — Assessment & Plan Note (Signed)
Improving slowly Not sure this is from statin--- stay off statin 1 more week May be from muscle spasms in low back --- rto if symptoms do not completely resolve

## 2021-05-30 NOTE — Progress Notes (Signed)
Established Patient Office Visit  Subjective:  Patient ID: Kaitlyn Perez, female    DOB: 1956/05/18  Age: 65 y.o. MRN: [id number]  CC:  Chief Complaint  Patient presents with  . Numbness    Pt states having tingling and numbness in right leg. Pt states sxs started when taking statins. Pt states soreness in joints. Pt states being off statin and started Pravachol on Friday.     HPI Kaitlyn Perez presents for numbness/ tingling R leg since she started lipitor.  She stopped it 7/27 and most of the symptoms of achiness cleared up----- numbness is still present.   Past Medical History:  Diagnosis Date  . Allergy   . Cancer ([place]) 2006, 2020   basal call carcinoma - left upper side of thorax, right face  . Cataract    are beginning   . Hyperlipidemia   . Osteopenia   . Stress fracture 2019   foot    Past Surgical History:  Procedure Laterality Date  . BREAST BIOPSY Right   . BREAST CYST ASPIRATION    . COLONOSCOPY    . MANDIBLE SURGERY Bilateral 08/2012  . NASAL SINUS SURGERY  2001  . POLYPECTOMY    . SKIN CANCER EXCISION    . TONSILLECTOMY AND ADENOIDECTOMY  1967    Family History  Problem Relation Age of Onset  . Arthritis Mother   . Hyperlipidemia Mother   . Hypotension Mother   . Cancer Mother 36       basal cell carcinoma  . Macular degeneration Mother   . Stroke Mother   . Thyroid disease Mother   . Immunodeficiency Mother   . Colon cancer Paternal Uncle   . Hyperlipidemia Father   . Hypertension Father   . Diabetes Father   . Stroke Father   . Lung cancer Father   . Colon polyps Father   . Melanoma Father   . Heart disease Maternal Grandfather 48       Aortic aneurysm  . Heart disease Maternal Grandmother 20       Multiple heart attacks  . Skin cancer Brother   . Arthritis Brother   . Uterine cancer Paternal Aunt   . Breast cancer Maternal Aunt   . Breast cancer Other        maternal female cousin  . Lung cancer Paternal Uncle        x's 2 smokers  .  Melanoma Paternal Uncle   . Stroke Paternal Uncle   . Heart Problems Paternal Uncle   . Heart disease Paternal Aunt   . Heart disease Maternal Aunt   . Heart disease Maternal Uncle   . Esophageal cancer Neg Hx   . Rectal cancer Neg Hx   . Stomach cancer Neg Hx     Social History   Socioeconomic History  . Marital status: Married    Spouse name: Shartavia Caines  . Number of children: Not on file  . Years of education: 22  . Highest education level: Not on file  Occupational History  . Occupation: IT sales professional: [employer]  Tobacco Use  . Smoking status: Never  . Smokeless tobacco: Never  Vaping Use  . Vaping Use: Never used  Substance and Sexual Activity  . Alcohol use: Not Currently  . Drug use: No  . Sexual activity: Not Currently    Partners: Male    Birth control/protection: Post-menopausal  Other Topics Concern  . Not on file  Social History Narrative   Exercise--yes, at least 4 days a week   Social Determinants of Radio broadcast assistant Strain: Not on file  Food Insecurity: Not on file  Transportation Needs: Not on file  Physical Activity: Not on file  Stress: Not on file  Social Connections: Not on file  Intimate Partner Violence: Not on file    Outpatient Medications Prior to Visit  Medication Sig Dispense Refill  . alendronate (FOSAMAX) 70 MG tablet Take 1 tablet (70 mg total) by mouth every 7 (seven) days. Take with a full glass of water on an empty stomach. 12 tablet 3  . estradiol (ESTRACE) 0.1 MG/GM vaginal cream Use 1/2 g vaginally every night for the first 2 weeks, then use 1/2 g vaginally two or three times per week as needed to maintain symptom relief. 42.5 g 1  . metroNIDAZOLE (METROCREAM) 0.75 % cream Apply topically 2 (two) times daily.    . pravastatin (PRAVACHOL) 20 MG tablet Take 1 tablet (20 mg total) by mouth daily. 30 tablet 2   Facility-Administered Medications Prior to Visit  Medication Dose Route Frequency Provider  Last Rate Last Admin  . 0.9 %  sodium chloride infusion  500 mL Intravenous Once Nandigam, Venia Minks, MD        Allergies  Allergen Reactions  . Neosporin [Neomycin-Polymyxin-Gramicidin] Rash    Local rash    ROS Review of Systems  Constitutional:  Negative for activity change, appetite change, fatigue and unexpected weight change.  Respiratory:  Negative for cough and shortness of breath.   Cardiovascular:  Negative for chest pain and palpitations.  Psychiatric/Behavioral:  Negative for behavioral problems and dysphoric mood. The patient is not nervous/anxious.      Objective:    Physical Exam Vitals and nursing note reviewed.  Constitutional:      Appearance: She is well-developed.  HENT:     Head: Normocephalic and atraumatic.  Eyes:     Conjunctiva/sclera: Conjunctivae normal.  Neck:     Thyroid: No thyromegaly.     Vascular: No carotid bruit or JVD.  Cardiovascular:     Rate and Rhythm: Normal rate and regular rhythm.     Heart sounds: Normal heart sounds. No murmur heard. Pulmonary:     Effort: Pulmonary effort is normal. No respiratory distress.     Breath sounds: Normal breath sounds. No wheezing or rales.  Chest:     Chest wall: No tenderness.  Musculoskeletal:     Cervical back: Normal range of motion and neck supple.  Neurological:     General: No focal deficit present.     Mental Status: She is alert and oriented to person, place, and time.     Sensory: No sensory deficit.     Motor: No weakness.     Coordination: Coordination normal.     Gait: Gait normal.     Deep Tendon Reflexes: Reflexes normal.  Psychiatric:        Mood and Affect: Mood normal.        Behavior: Behavior normal.        Thought Content: Thought content normal.    BP 120/82 (BP Location: Right Arm, Patient Position: Sitting, Cuff Size: Normal)   Pulse (!) 55   Temp 98 F (36.7 C) (Oral)   Resp 18   Ht 5' 3.5" (1.613 m)   Wt 142 lb 3.2 oz (64.5 kg)   LMP 10/17/2003  (Approximate)   SpO2 99%   BMI 24.79 kg/m  Wt Readings from Last 3 Encounters:  05/30/21 142 lb 3.2 oz (64.5 kg)  03/09/21 141 lb (64 kg)  01/03/21 137 lb 9.6 oz (62.4 kg)     Health Maintenance Due  Topic Date Due  . Pneumococcal Vaccine 81-76 Years old (1 - PCV) Never done  . INFLUENZA VACCINE  05/16/2021    There are no preventive care reminders to display for this patient.  Lab Results  Component Value Date   TSH 3.49 01/03/2021   Lab Results  Component Value Date   WBC 4.2 01/03/2021   HGB 14.0 01/03/2021   HCT 42.0 01/03/2021   MCV 95.1 01/03/2021   PLT 273.0 01/03/2021   Lab Results  Component Value Date   NA 144 04/21/2021   K 4.5 04/21/2021   CO2 28 04/21/2021   GLUCOSE 84 04/21/2021   BUN 13 04/21/2021   CREATININE 0.73 04/21/2021   BILITOT 0.6 04/21/2021   ALKPHOS 37 (L) 04/21/2021   AST 18 04/21/2021   ALT 16 04/21/2021   PROT 6.6 04/21/2021   ALBUMIN 4.4 04/21/2021   CALCIUM 9.6 04/21/2021   GFR 86.64 04/21/2021   Lab Results  Component Value Date   CHOL 208 (H) 04/21/2021   Lab Results  Component Value Date   HDL 91.80 04/21/2021   Lab Results  Component Value Date   LDLCALC 101 (H) 04/21/2021   Lab Results  Component Value Date   TRIG 75.0 04/21/2021   Lab Results  Component Value Date   CHOLHDL 2 04/21/2021   No results found for: HGBA1C    Assessment & Plan:   Problem List Items Addressed This Visit       Unprioritized   Hyperlipidemia    Hold statin for 1 more week We will try pravachol --- she will call if there is a problem She is willing to go back on lipitor 10 mg ---she did not have any symptoms on that dose       Numbness and tingling of right leg - Primary    Improving slowly Not sure this is from statin--- stay off statin 1 more week May be from muscle spasms in low back --- rto if symptoms do not completely resolve       No orders of the defined types were placed in this encounter.   Follow-up:  Return if symptoms worsen or fail to improve.    Ann Held, DO

## 2021-08-24 ENCOUNTER — Other Ambulatory Visit: Payer: Self-pay

## 2021-08-24 ENCOUNTER — Ambulatory Visit (INDEPENDENT_AMBULATORY_CARE_PROVIDER_SITE_OTHER): Payer: Medicare PPO | Admitting: *Deleted

## 2021-08-24 DIAGNOSIS — Z23 Encounter for immunization: Secondary | ICD-10-CM | POA: Diagnosis not present

## 2021-08-24 NOTE — Progress Notes (Signed)
Patient here today for high dose flu vaccine and pneumonia vaccine.  Flu vaccine given in left deltoid and pneumonia vaccine given in right deltoid.  Patient tolerated both vaccines well.

## 2021-08-25 ENCOUNTER — Ambulatory Visit
Admission: RE | Admit: 2021-08-25 | Discharge: 2021-08-25 | Disposition: A | Discharge status: Home / Self Care | Payer: Medicare PPO | Source: Ambulatory Visit | Attending: Family Medicine | Admitting: Family Medicine

## 2021-08-25 DIAGNOSIS — M85832 Other specified disorders of bone density and structure, left forearm: Secondary | ICD-10-CM | POA: Diagnosis not present

## 2021-08-25 DIAGNOSIS — Z780 Asymptomatic menopausal state: Secondary | ICD-10-CM | POA: Diagnosis not present

## 2021-08-25 DIAGNOSIS — Z1231 Encounter for screening mammogram for malignant neoplasm of breast: Secondary | ICD-10-CM | POA: Diagnosis not present

## 2021-08-25 DIAGNOSIS — Z13820 Encounter for screening for osteoporosis: Secondary | ICD-10-CM

## 2021-08-28 ENCOUNTER — Encounter: Payer: Self-pay | Admitting: Family Medicine

## 2021-08-29 ENCOUNTER — Other Ambulatory Visit: Payer: Self-pay | Admitting: Family Medicine

## 2021-08-29 DIAGNOSIS — M8580 Other specified disorders of bone density and structure, unspecified site: Secondary | ICD-10-CM

## 2021-08-29 MED ORDER — ALENDRONATE SODIUM 70 MG PO TABS
70.0000 mg | ORAL_TABLET | ORAL | 3 refills | Status: DC
Start: 2021-08-29 — End: 2022-02-14

## 2021-08-31 ENCOUNTER — Other Ambulatory Visit: Payer: Self-pay | Admitting: Family Medicine

## 2021-09-14 ENCOUNTER — Other Ambulatory Visit (INDEPENDENT_AMBULATORY_CARE_PROVIDER_SITE_OTHER): Payer: Medicare PPO

## 2021-09-14 ENCOUNTER — Other Ambulatory Visit: Payer: Self-pay

## 2021-09-14 DIAGNOSIS — E785 Hyperlipidemia, unspecified: Secondary | ICD-10-CM | POA: Diagnosis not present

## 2021-09-14 LAB — COMPREHENSIVE METABOLIC PANEL
ALT: 15 U/L (ref 0–35)
AST: 18 U/L (ref 0–37)
Albumin: 4.2 g/dL (ref 3.5–5.2)
Alkaline Phosphatase: 34 U/L — ABNORMAL LOW (ref 39–117)
BUN: 15 mg/dL (ref 6–23)
CO2: 28 mEq/L (ref 19–32)
Calcium: 9.4 mg/dL (ref 8.4–10.5)
Chloride: 108 mEq/L (ref 96–112)
Creatinine, Ser: 0.76 mg/dL (ref 0.40–1.20)
GFR: 82.32 mL/min (ref >60.00)
Glucose, Bld: 86 mg/dL (ref 70–99)
Potassium: 3.9 mEq/L (ref 3.5–5.1)
Sodium: 144 mEq/L (ref 135–145)
Total Bilirubin: 0.5 mg/dL (ref 0.2–1.2)
Total Protein: 6.3 g/dL (ref 6.0–8.3)

## 2021-09-14 LAB — LIPID PANEL
Cholesterol: 213 mg/dL — ABNORMAL HIGH (ref 0–200)
HDL: 92.4 mg/dL (ref >39.00)
LDL Cholesterol: 107 mg/dL — ABNORMAL HIGH (ref 0–99)
NonHDL: 120.78
Total CHOL/HDL Ratio: 2
Triglycerides: 69 mg/dL (ref 0.0–149.0)
VLDL: 13.8 mg/dL (ref 0.0–40.0)

## 2021-09-21 ENCOUNTER — Encounter: Payer: Self-pay | Admitting: Family Medicine

## 2021-09-21 ENCOUNTER — Other Ambulatory Visit: Payer: Self-pay

## 2021-09-21 MED ORDER — PRAVASTATIN SODIUM 40 MG PO TABS: 40.0000 mg | ORAL_TABLET | Freq: Every day | ORAL | 2 refills | Status: DC

## 2021-09-21 MED ORDER — PRAVASTATIN SODIUM 20 MG PO TABS: 20.0000 mg | ORAL_TABLET | Freq: Every day | ORAL | 1 refills | Status: DC

## 2021-09-21 NOTE — Telephone Encounter (Signed)
Will submit clinical information to [facility] for Prolia. Will call when received.

## 2021-09-23 NOTE — Telephone Encounter (Signed)
Kaitlyn Perez with [employer] called to verify which insurance plan pt had.  I confirmed with him pt currently is verifying with [facility].

## 2021-09-30 ENCOUNTER — Telehealth: Payer: Self-pay

## 2021-09-30 NOTE — Telephone Encounter (Signed)
Okay to schedule. $0.00 at time of injection before the end of the year.

## 2021-09-30 NOTE — Telephone Encounter (Signed)
Called patient to get scheduled. Patient states she had no idea she was going to start this???? And just wants someone to call and explain it. ( I did tell her it was for bones) She has no problem starting it just wants to understand a bit more  Patient is out of town and will call back next week to schedule

## 2021-10-04 NOTE — Telephone Encounter (Signed)
Pt called. Left detailed voicemail regarding Prolia.

## 2021-10-05 ENCOUNTER — Encounter: Payer: Self-pay | Admitting: Family Medicine

## 2021-10-05 NOTE — Telephone Encounter (Signed)
FYI

## 2021-11-25 ENCOUNTER — Telehealth: Payer: Self-pay | Admitting: Family Medicine

## 2021-11-25 ENCOUNTER — Other Ambulatory Visit: Payer: Self-pay | Admitting: *Deleted

## 2021-11-25 DIAGNOSIS — E785 Hyperlipidemia, unspecified: Secondary | ICD-10-CM

## 2021-11-25 NOTE — Progress Notes (Signed)
Lipid panel ordered.

## 2021-11-25 NOTE — Telephone Encounter (Signed)
Sent pt mychart message letting her know she is good to come for her lab appointment.

## 2021-11-25 NOTE — Telephone Encounter (Signed)
Patient has a lab appointment for 03/02, no future labs are on chart. Per pt on Mychart, her labs are to be checked every 90 day. Please advise.   Message on mychart from patient: "Dr. Cheri Rous increased my pravastatin after 11/30 labs.  I understood that she was monitoring labs every 90 days until we get a prescription that works.  If not, then I will just need a prescription renewal as there are no more refills for the 40 mg pravastatin."

## 2021-12-14 NOTE — Addendum Note (Signed)
Addended by: Kelle Darting A on: 12/14/2021 02:12 PM   Modules accepted: Orders

## 2021-12-15 ENCOUNTER — Ambulatory Visit: Payer: Medicare PPO | Admitting: Family Medicine

## 2021-12-15 ENCOUNTER — Other Ambulatory Visit (INDEPENDENT_AMBULATORY_CARE_PROVIDER_SITE_OTHER): Payer: Medicare PPO

## 2021-12-15 DIAGNOSIS — E785 Hyperlipidemia, unspecified: Secondary | ICD-10-CM

## 2021-12-15 LAB — LIPID PANEL
Cholesterol: 217 mg/dL — ABNORMAL HIGH (ref 0–200)
HDL: 93.9 mg/dL (ref >39.00)
LDL Cholesterol: 108 mg/dL — ABNORMAL HIGH (ref 0–99)
NonHDL: 123.21
Total CHOL/HDL Ratio: 2
Triglycerides: 75 mg/dL (ref 0.0–149.0)
VLDL: 15 mg/dL (ref 0.0–40.0)

## 2021-12-19 ENCOUNTER — Encounter: Payer: Self-pay | Admitting: Family Medicine

## 2021-12-19 DIAGNOSIS — E785 Hyperlipidemia, unspecified: Secondary | ICD-10-CM

## 2021-12-19 MED ORDER — PRAVASTATIN SODIUM 40 MG PO TABS: 40.0000 mg | ORAL_TABLET | Freq: Every day | ORAL | 1 refills | Status: DC

## 2022-02-14 ENCOUNTER — Encounter: Payer: Self-pay | Admitting: Family Medicine

## 2022-02-14 ENCOUNTER — Ambulatory Visit (INDEPENDENT_AMBULATORY_CARE_PROVIDER_SITE_OTHER): Payer: Medicare PPO | Admitting: Family Medicine

## 2022-02-14 VITALS — BP 110/60 | HR 52 | Temp 97.8°F | Resp 18 | Ht 63.5 in | Wt 143.2 lb

## 2022-02-14 DIAGNOSIS — Z0000 Encounter for general adult medical examination without abnormal findings: Secondary | ICD-10-CM

## 2022-02-14 DIAGNOSIS — M8580 Other specified disorders of bone density and structure, unspecified site: Secondary | ICD-10-CM

## 2022-02-14 DIAGNOSIS — E785 Hyperlipidemia, unspecified: Secondary | ICD-10-CM

## 2022-02-14 DIAGNOSIS — R001 Bradycardia, unspecified: Secondary | ICD-10-CM | POA: Diagnosis not present

## 2022-02-14 LAB — COMPREHENSIVE METABOLIC PANEL
ALT: 13 U/L (ref 0–35)
AST: 17 U/L (ref 0–37)
Albumin: 4.7 g/dL (ref 3.5–5.2)
Alkaline Phosphatase: 39 U/L (ref 39–117)
BUN: 17 mg/dL (ref 6–23)
CO2: 31 mEq/L (ref 19–32)
Calcium: 9.7 mg/dL (ref 8.4–10.5)
Chloride: 104 mEq/L (ref 96–112)
Creatinine, Ser: 0.97 mg/dL (ref 0.40–1.20)
GFR: 61.25 mL/min (ref >60.00)
Glucose, Bld: 97 mg/dL (ref 70–99)
Potassium: 4.3 mEq/L (ref 3.5–5.1)
Sodium: 142 mEq/L (ref 135–145)
Total Bilirubin: 0.7 mg/dL (ref 0.2–1.2)
Total Protein: 7 g/dL (ref 6.0–8.3)

## 2022-02-14 LAB — CBC WITH DIFFERENTIAL/PLATELET
Basophils Absolute: 0 10*3/uL (ref 0.0–0.1)
Basophils Relative: 1.3 % (ref 0.0–3.0)
Eosinophils Absolute: 0.1 10*3/uL (ref 0.0–0.7)
Eosinophils Relative: 2.1 % (ref 0.0–5.0)
HCT: 42.8 % (ref 36.0–46.0)
Hemoglobin: 14.2 g/dL (ref 12.0–15.0)
Lymphocytes Relative: 30.4 % (ref 12.0–46.0)
Lymphs Abs: 1.1 10*3/uL (ref 0.7–4.0)
MCHC: 33.1 g/dL (ref 30.0–36.0)
MCV: 94.6 fl (ref 78.0–100.0)
Monocytes Absolute: 0.5 10*3/uL (ref 0.1–1.0)
Monocytes Relative: 13.6 % — ABNORMAL HIGH (ref 3.0–12.0)
Neutro Abs: 1.9 10*3/uL (ref 1.4–7.7)
Neutrophils Relative %: 52.6 % (ref 43.0–77.0)
Platelets: 242 10*3/uL (ref 150.0–400.0)
RBC: 4.53 Mil/uL (ref 3.87–5.11)
RDW: 13.9 % (ref 11.5–15.5)
WBC: 3.7 10*3/uL — ABNORMAL LOW (ref 4.0–10.5)

## 2022-02-14 LAB — LIPID PANEL
Cholesterol: 228 mg/dL — ABNORMAL HIGH (ref 0–200)
HDL: 97.1 mg/dL (ref >39.00)
LDL Cholesterol: 112 mg/dL — ABNORMAL HIGH (ref 0–99)
NonHDL: 130.76
Total CHOL/HDL Ratio: 2
Triglycerides: 96 mg/dL (ref 0.0–149.0)
VLDL: 19.2 mg/dL (ref 0.0–40.0)

## 2022-02-14 MED ORDER — ALENDRONATE SODIUM 70 MG PO TABS: 70.0000 mg | ORAL_TABLET | ORAL | 3 refills | Status: DC

## 2022-02-14 NOTE — Progress Notes (Signed)
? ?Subjective:  ? ? Kaitlyn Perez is a 66 y.o. female who presents for a Welcome to Medicare exam.  ? ?Review of Systems ?Review of Systems  ?Constitutional: Negative for activity change, appetite change and fatigue.  ?HENT: Negative for hearing loss, congestion, tinnitus and ear discharge.  dentist q58m?Eyes: Negative for visual disturbance (see optho q1y -- vision corrected to 20/20 with glasses).  ?Respiratory: Negative for cough, chest tightness and shortness of breath.   ?Cardiovascular: Negative for chest pain, palpitations and leg swelling.  ?Gastrointestinal: Negative for abdominal pain, diarrhea, constipation and abdominal distention.  ?Genitourinary: Negative for urgency, frequency, decreased urine volume and difficulty urinating.  ?Musculoskeletal: Negative for back pain, arthralgias and gait problem.  ?Skin: Negative for color change, pallor and rash.  ?Neurological: Negative for dizziness, light-headedness, numbness and headaches.  ?Hematological: Negative for adenopathy. Does not bruise/bleed easily.  ?Psychiatric/Behavioral: Negative for suicidal ideas, confusion, sleep disturbance, self-injury, dysphoric mood, decreased concentration and agitation.  ? ? ? ?  ? ?    ?Objective:  ?  ?Today's Vitals  ? 02/14/22 1006  ?BP: 110/60  ?Pulse: (!) 52  ?Resp: 18  ?Temp: 97.8 ?F (36.6 ?C)  ?TempSrc: Oral  ?SpO2: 100%  ?Weight: 143 lb 3.2 oz (65 kg)  ?Height: 5' 3.5" (1.613 m)  ?Body mass index is 24.97 kg/m?. ? ?Medications ?Outpatient Encounter Medications as of 02/14/2022  ?Medication Sig  ? estradiol (ESTRACE) 0.1 MG/GM vaginal cream Use 1/2 g vaginally every night for the first 2 weeks, then use 1/2 g vaginally two or three times per week as needed to maintain symptom relief.  ? metroNIDAZOLE (METROCREAM) 0.75 % cream Apply topically 2 (two) times daily.  ? pravastatin (PRAVACHOL) 40 MG tablet Take 1 tablet (40 mg total) by mouth daily.  ? [DISCONTINUED] alendronate (FOSAMAX) 70 MG tablet Take 1 tablet (70 mg  total) by mouth every 7 (seven) days. Take with a full glass of water on an empty stomach.  ? alendronate (FOSAMAX) 70 MG tablet Take 1 tablet (70 mg total) by mouth every 7 (seven) days. Take with a full glass of water on an empty stomach.  ? ?Facility-Administered Encounter Medications as of 02/14/2022  ?Medication  ? 0.9 %  sodium chloride infusion  ?  ? ?History: ?Past Medical History:  ?Diagnosis Date  ? Allergy   ? Cancer [place] 2006, [place]  ? basal call carcinoma - left upper side of thorax, right face  ? Cataract   ? are beginning   ? Hyperlipidemia   ? Osteopenia   ? Stress fracture 2019  ? foot  ? ?Past Surgical History:  ?Procedure Laterality Date  ? BREAST BIOPSY Right   ? BREAST CYST ASPIRATION    ? COLONOSCOPY    ? MANDIBLE SURGERY Bilateral 08/2012  ? NASAL SINUS SURGERY  2001  ? POLYPECTOMY    ? SKIN CANCER EXCISION    ? [place] ?  ?Family History  ?Problem Relation Age of Onset  ? Arthritis Mother   ? Hyperlipidemia Mother   ? Hypotension Mother   ? Cancer Mother 725 ?     basal cell carcinoma  ? Macular degeneration Mother   ? Stroke Mother   ? Thyroid disease Mother   ? Immunodeficiency Mother   ? Heart block Mother   ? Hyperlipidemia Father   ? Hypertension Father   ? Diabetes Father   ? Stroke Father   ? Lung cancer Father   ? Colon  polyps Father   ? Melanoma Father   ? Skin cancer Brother   ? Arthritis Brother   ? Heart disease Maternal Grandmother 5  ?     Multiple heart attacks  ? Heart disease Maternal Grandfather 7  ?     Aortic aneurysm  ? Breast cancer Maternal Aunt   ? Heart disease Maternal Aunt   ? Heart disease Maternal Uncle   ? Uterine cancer Paternal Aunt   ? Heart disease Paternal Aunt   ? Colon cancer Paternal Uncle   ? Lung cancer Paternal Uncle   ?     x's 2 smokers  ? Melanoma Paternal Uncle   ? Stroke Paternal Uncle   ? Heart Problems Paternal Uncle   ? Breast cancer Other   ?     maternal female cousin  ? Esophageal cancer Neg Hx   ? Rectal cancer  Neg Hx   ? Stomach cancer Neg Hx   ? ?Social History  ? ?Occupational History  ? Occupation: Advertising account planner VP  ?  Employer: [employer]  ?Tobacco Use  ? Smoking status: Never  ? Smokeless tobacco: Never  ?Vaping Use  ? Vaping Use: Never used  ?Substance and Sexual Activity  ? Alcohol use: Not Currently  ? Drug use: No  ? Sexual activity: Not Currently  ?  Partners: Male  ?  Birth control/protection: Post-menopausal  ? ? ?Tobacco Counseling ?Counseling given: Not Answered ? ? ?Immunizations and Health Maintenance ?Immunization History  ?Administered Date(s) Administered  ? Fluad Quad(high Dose 65+) 08/24/2021  ? Influenza Whole 08/14/2011  ? Influenza,inj,Quad PF,6+ Mos 10/05/2015, 09/21/2016, 09/11/2017, 08/21/2018, 07/17/2019, 08/12/2020  ? PFIZER(Purple Top)SARS-COV-2 Vaccination 01/16/2020, 02/09/2020, 09/05/2020, 04/04/2021  ? PNEUMOCOCCAL CONJUGATE-20 08/24/2021  ? Pension scheme manager 27yr & up 12/15/2021  ? Tdap 01/09/2013  ? Zoster Recombinat (Shingrix) 08/21/2018, 10/23/2018  ? Zoster, Live 09/21/2016  ? ?Health Maintenance Due  ?Topic Date Due  ? PAP SMEAR-Modifier  12/25/2021  ? ? ?Activities of Daily Living ?   ? View : No data to display.  ?  ?  ?  ? ? ?Physical Exam  BP 110/60 (BP Location: Right Arm, Patient Position: Sitting, Cuff Size: Normal)   Pulse (!) 52   Temp 97.8 ?F (36.6 ?C) (Oral)   Resp 18   Ht 5' 3.5" (1.613 m)   Wt 143 lb 3.2 oz (65 kg)   LMP 10/17/2003 (Approximate)   SpO2 100%   BMI 24.97 kg/m?  ?General appearance: alert, cooperative, appears stated age, and no distress ?Head: Normocephalic, without obvious abnormality, atraumatic ?Eyes: conjunctivae/corneas clear. PERRL, EOM's intact. Fundi benign. ?Ears: normal TM's and external ear canals both ears ?Nose: Nares normal. Septum midline. Mucosa normal. No drainage or sinus tenderness. ?Throat: lips, mucosa, and tongue normal; teeth and gums normal ?Neck: no adenopathy, no carotid bruit, no JVD, supple,  symmetrical, trachea midline, and thyroid not enlarged, symmetric, no tenderness/mass/nodules ?Back: symmetric, no curvature. ROM normal. No CVA tenderness. ?Lungs: clear to auscultation bilaterally ?Heart: regular rate and rhythm, S1, S2 normal, no murmur, click, rub or gallop ?Abdomen: soft, non-tender; bowel sounds normal; no masses,  no organomegaly ?Extremities: extremities normal, atraumatic, no cyanosis or edema ?Pulses: 2+ and symmetric ?Skin: Skin color, texture, turgor normal. No rashes or lesions ?Lymph nodes: Cervical, supraclavicular, and axillary nodes normal. ?Neurologic: Alert and oriented X 3, normal strength and tone. Normal symmetric reflexes. Normal coordination and gait (optional), or other factors deemed appropriate based on the beneficiary's  medical and social history and current clinical standards. ? ?Advanced Directives: ?Does Patient Have a Medical Advance Directive?: Yes ?Type of Advance Directive: Healthcare Power of [place], Living will ?Does patient want to make changes to medical advance directive?: Yes (MAU/Ambulatory/Procedural Areas - Information given) ?   ?Assessment:  ?  ?This is a routine wellness examination for this patient .  ? ?Vision/Hearing screen ?Vision Screening  ? Right eye Left eye Both eyes  ?Without correction     ?With correction 20/20-1 20/25-1 20/20  ?Comments: Patient had contacts  ? ?Dietary issues and exercise activities discussed:  ?  ? ? Goals   ?None ?  ? ?Depression Screen ? ?  02/14/2022  ? 10:13 AM 02/14/2022  ? 10:10 AM 01/03/2021  ?  1:07 PM 12/30/2019  ? 10:02 AM  ?PHQ 2/9 Scores  ?PHQ - 2 Score 0 0 0 0  ?  ? ?Fall Risk ? ?  02/14/2022  ? 10:13 AM  ?Fall Risk   ?Falls in the past year? 0  ?Number falls in past yr: 0  ?Injury with Fall? 0  ?Follow up Falls evaluation completed  ? ? ?Cognitive Function: ?  ? Mmse-- 30/30 ?  ? ?Patient Care Team: ?Carollee Herter, Alferd Apa, DO as PCP - General (Family Medicine) ?[place], Jennefer Bravo, MD as Referring Physician  (Dermatology) ?Janne Lab, MD (Obstetrics and Gynecology) ? ?   ?Plan:  ? 1. Osteopenia, unspecified location ? ?- alendronate (FOSAMAX) 70 MG tablet; Take 1 tablet (70 mg total) by mouth every 7 (seven) days. Take

## 2022-02-14 NOTE — Progress Notes (Signed)
? ?Subjective:  ? ? Kaitlyn Perez is a 66 y.o. female who presents for a Welcome to Medicare exam.  ? ?Review of Systems ?  ?Gynecology and dermatology appointment scheduled. ?Her mother developed a heart block in January. ? ?She denies fever, hearing loss, ear pain,congestion, sinus pain, sore throat, eye pain, chest pain, palpitations, cough, shortness of breath, wheezing, nausea. vomiting, diarrhea, constipation, blood in stool, dysuria,frequency, hematuria and headaches.  ?    ?Objective:  ?  ?Today's Vitals  ? 02/14/22 1006  ?BP: 110/60  ?Pulse: (!) 52  ?Resp: 18  ?Temp: 97.8 ?F (36.6 ?C)  ?TempSrc: Oral  ?SpO2: 100%  ?Weight: 143 lb 3.2 oz (65 kg)  ?Height: 5' 3.5" (1.613 m)  ?Body mass index is 24.97 kg/m?. ? ?Medications ?Outpatient Encounter Medications as of 02/14/2022  ?Medication Sig  ? estradiol (ESTRACE) 0.1 MG/GM vaginal cream Use 1/2 g vaginally every night for the first 2 weeks, then use 1/2 g vaginally two or three times per week as needed to maintain symptom relief.  ? metroNIDAZOLE (METROCREAM) 0.75 % cream Apply topically 2 (two) times daily.  ? pravastatin (PRAVACHOL) 40 MG tablet Take 1 tablet (40 mg total) by mouth daily.  ? [DISCONTINUED] alendronate (FOSAMAX) 70 MG tablet Take 1 tablet (70 mg total) by mouth every 7 (seven) days. Take with a full glass of water on an empty stomach.  ? alendronate (FOSAMAX) 70 MG tablet Take 1 tablet (70 mg total) by mouth every 7 (seven) days. Take with a full glass of water on an empty stomach.  ? ?Facility-Administered Encounter Medications as of 02/14/2022  ?Medication  ? 0.9 %  sodium chloride infusion  ?  ? ?History: ?Past Medical History:  ?Diagnosis Date  ? Allergy   ? Cancer ([place]) 2006, 2020  ? basal call carcinoma - left upper side of thorax, right face  ? Cataract   ? are beginning   ? Hyperlipidemia   ? Osteopenia   ? Stress fracture 2019  ? foot  ? ?Past Surgical History:  ?Procedure Laterality Date  ? BREAST BIOPSY Right   ? BREAST CYST ASPIRATION     ? COLONOSCOPY    ? MANDIBLE SURGERY Bilateral 08/2012  ? NASAL SINUS SURGERY  2001  ? POLYPECTOMY    ? SKIN CANCER EXCISION    ? [place]  ?  ?Family History  ?Problem Relation Age of Onset  ? Arthritis Mother   ? Hyperlipidemia Mother   ? Hypotension Mother   ? Cancer Mother 49  ?     basal cell carcinoma  ? Macular degeneration Mother   ? Stroke Mother   ? Thyroid disease Mother   ? Immunodeficiency Mother   ? Heart block Mother   ? Hyperlipidemia Father   ? Hypertension Father   ? Diabetes Father   ? Stroke Father   ? Lung cancer Father   ? Colon polyps Father   ? Melanoma Father   ? Skin cancer Brother   ? Arthritis Brother   ? Heart disease Maternal Grandmother 13  ?     Multiple heart attacks  ? Heart disease Maternal Grandfather 54  ?     Aortic aneurysm  ? Breast cancer Maternal Aunt   ? Heart disease Maternal Aunt   ? Heart disease Maternal Uncle   ? Uterine cancer Paternal Aunt   ? Heart disease Paternal Aunt   ? Colon cancer Paternal Uncle   ? Lung cancer Paternal Uncle   ?  x's 2 smokers  ? Melanoma Paternal Uncle   ? Stroke Paternal Uncle   ? Heart Problems Paternal Uncle   ? Breast cancer Other   ?     maternal female cousin  ? Esophageal cancer Neg Hx   ? Rectal cancer Neg Hx   ? Stomach cancer Neg Hx   ? ?Social History  ? ?Occupational History  ? Occupation: Advertising account planner VP  ?  Employer: [employer]  ?Tobacco Use  ? Smoking status: Never  ? Smokeless tobacco: Never  ?Vaping Use  ? Vaping Use: Never used  ?Substance and Sexual Activity  ? Alcohol use: Not Currently  ? Drug use: No  ? Sexual activity: Not Currently  ?  Partners: Male  ?  Birth control/protection: Post-menopausal  ? ? ?Tobacco Counseling ?Counseling given: Not Answered ? ? ?Immunizations and Health Maintenance ?Immunization History  ?Administered Date(s) Administered  ? Fluad Quad(high Dose 65+) 08/24/2021  ? Influenza Whole 08/14/2011  ? Influenza,inj,Quad PF,6+ Mos 10/05/2015, 09/21/2016, 09/11/2017,  08/21/2018, 07/17/2019, 08/12/2020  ? PFIZER(Purple Top)SARS-COV-2 Vaccination 01/16/2020, 02/09/2020, 09/05/2020, 04/04/2021  ? PNEUMOCOCCAL CONJUGATE-20 08/24/2021  ? Pension scheme manager 62yr & up 12/15/2021  ? Tdap 01/09/2013  ? Zoster Recombinat (Shingrix) 08/21/2018, 10/23/2018  ? Zoster, Live 09/21/2016  ? ?Health Maintenance Due  ?Topic Date Due  ? PAP SMEAR-Modifier  12/25/2021  ? ? ?Activities of Daily Living ?   ? View : No data to display.  ?  ?  ?  ? ? ?Physical Exam  BP 110/60 (BP Location: Right Arm, Patient Position: Sitting, Cuff Size: Normal)   Pulse (!) 52   Temp 97.8 ?F (36.6 ?C) (Oral)   Resp 18   Ht 5' 3.5" (1.613 m)   Wt 143 lb 3.2 oz (65 kg)   LMP 10/17/2003 (Approximate)   SpO2 100%   BMI 24.97 kg/m?  ?General appearance: alert, cooperative, appears stated age, and no distress ?Head: Normocephalic, without obvious abnormality, atraumatic ?Eyes: conjunctivae/corneas clear. PERRL, EOM's intact. Fundi benign. ?Ears: normal TM's and external ear canals both ears ?Nose: Nares normal. Septum midline. Mucosa normal. No drainage or sinus tenderness. ?Throat: lips, mucosa, and tongue normal; teeth and gums normal ?Neck: no adenopathy, no carotid bruit, no JVD, supple, symmetrical, trachea midline, and thyroid not enlarged, symmetric, no tenderness/mass/nodules ?Back: symmetric, no curvature. ROM normal. No CVA tenderness. ?Lungs: clear to auscultation bilaterally ?Heart: regular rate and rhythm, S1, S2 normal, no murmur, click, rub or gallop ?Abdomen: soft, non-tender; bowel sounds normal; no masses,  no organomegaly ?Extremities: extremities normal, atraumatic, no cyanosis or edema ?Pulses: 2+ and symmetric ?Skin: Skin color, texture, turgor normal. No rashes or lesions ?Lymph nodes: Cervical, supraclavicular, and axillary nodes normal. ?Neurologic: Alert and oriented X 3, normal strength and tone. Normal symmetric reflexes. Normal coordination and gait (optional), or  other factors deemed appropriate based on the beneficiary's medical and social history and current clinical standards. ? ?Advanced Directives: ?Does Patient Have a Medical Advance Directive?: Yes ?Type of Advance Directive: [place] ?Does patient want to make changes to medical advance directive?: Yes (MAU/Ambulatory/Procedural Areas - Information given) ?Patient has living will and power of attorney at this time. She will bring it in at her next visit ?   ?Assessment:  ?  ?This is a routine wellness examination for this patient .  ? ?Vision/Hearing screen ?Vision Screening  ? Right eye Left eye Both eyes  ?Without correction     ?With correction  20/20-1 20/25-1 20/20  ?Comments: Patient had contacts  ? ?UTD on dental and vision ? ?Dietary issues and exercise activities discussed:  ?  ? ? Goals   ?None ?  ? ?Depression Screen ? ?  02/14/2022  ? 10:13 AM 02/14/2022  ? 10:10 AM 01/03/2021  ?  1:07 PM 12/30/2019  ? 10:02 AM  ?PHQ 2/9 Scores  ?PHQ - 2 Score 0 0 0 0  ?  ? ?Fall Risk ? ?  02/14/2022  ? 10:13 AM  ?Fall Risk   ?Falls in the past year? 0  ?Number falls in past yr: 0  ?Injury with Fall? 0  ?Follow up Falls evaluation completed  ? ? ?Cognitive Function: ?  ?  ?  ? ?Patient Care Team: ?Carollee Herter, Alferd Apa, DO as PCP - General (Family Medicine) ?H[place], Jennefer Bravo, MD as Referring Physician (Dermatology) ?Janne Lab, MD (Obstetrics and Gynecology) ? ?   ?Plan:  ? 1. Osteopenia, unspecified location ? ? ?- alendronate (FOSAMAX) 70 MG tablet; Take 1 tablet (70 mg total) by mouth every 7 (seven) days. Take with a full glass of water on an empty stomach.  Dispense: 12 tablet; Refill: 3 ? ?2. Encounter for Medicare annual wellness exam ? ? ? ?3. Preventative health care ?Ghm utd ?Check labs  ? ?4. Welcome to Medicare preventive visit ? ? ?- CBC with Differential/Platelet ?- Comprehensive metabolic panel ?- Lipid panel ?- EKG 12-Lead ? ?5. Hyperlipidemia, unspecified hyperlipidemia  type ?Encourage heart healthy diet such as MIND or DASH diet, increase exercise, avoid trans fats, simple carbohydrates and processed foods, consider a krill or fish or flaxseed oil cap daily.   ?- Comprehensive met

## 2022-02-14 NOTE — Patient Instructions (Signed)
Preventive Care 7 Years and Older, Female ?Preventive care refers to lifestyle choices and visits with your health care provider that can promote health and wellness. Preventive care visits are also called wellness exams. ?What can I expect for my preventive care visit? ?Counseling ?Your health care provider may ask you questions about your: ?Medical history, including: ?Past medical problems. ?Family medical history. ?Pregnancy and menstrual history. ?History of falls. ?Current health, including: ?Memory and ability to understand (cognition). ?Emotional well-being. ?Home life and relationship well-being. ?Sexual activity and sexual health. ?Lifestyle, including: ?Alcohol, nicotine or tobacco, and drug use. ?Access to firearms. ?Diet, exercise, and sleep habits. ?Work and work Statistician. ?Sunscreen use. ?Safety issues such as seatbelt and bike helmet use. ?Physical exam ?Your health care provider will check your: ?Height and weight. These may be used to calculate your BMI (body mass index). BMI is a measurement that tells if you are at a healthy weight. ?Waist circumference. This measures the distance around your waistline. This measurement also tells if you are at a healthy weight and may help predict your risk of certain diseases, such as type 2 diabetes and high blood pressure. ?Heart rate and blood pressure. ?Body temperature. ?Skin for abnormal spots. ?What immunizations do I need? ? ?Vaccines are usually given at various ages, according to a schedule. Your health care provider will recommend vaccines for you based on your age, medical history, and lifestyle or other factors, such as travel or where you work. ?What tests do I need? ?Screening ?Your health care provider may recommend screening tests for certain conditions. This may include: ?Lipid and cholesterol levels. ?Hepatitis C test. ?Hepatitis B test. ?HIV (human immunodeficiency virus) test. ?STI (sexually transmitted infection) testing, if you are at  risk. ?Lung cancer screening. ?Colorectal cancer screening. ?Diabetes screening. This is done by checking your blood sugar (glucose) after you have not eaten for a while (fasting). ?Mammogram. Talk with your health care provider about how often you should have regular mammograms. ?BRCA-related cancer screening. This may be done if you have a family history of breast, ovarian, tubal, or peritoneal cancers. ?Bone density scan. This is done to screen for osteoporosis. ?Talk with your health care provider about your test results, treatment options, and if necessary, the need for more tests. ?Follow these instructions at home: ?Eating and drinking ? ?Eat a diet that includes fresh fruits and vegetables, whole grains, lean protein, and low-fat dairy products. Limit your intake of foods with high amounts of sugar, saturated fats, and salt. ?Take vitamin and mineral supplements as recommended by your health care provider. ?Do not drink alcohol if your health care provider tells you not to drink. ?If you drink alcohol: ?Limit how much you have to 0-1 drink a day. ?Know how much alcohol is in your drink. In the [place] one drink equals one 12 oz bottle of beer (355 mL), one 5 oz glass of wine (148 mL), or one 1? oz glass of hard liquor (44 mL). ?Lifestyle ?Brush your teeth every morning and night with fluoride toothpaste. Floss one time each day. ?Exercise for at least 30 minutes 5 or more days each week. ?Do not use any products that contain nicotine or tobacco. These products include cigarettes, chewing tobacco, and vaping devices, such as e-cigarettes. If you need help quitting, ask your health care provider. ?Do not use drugs. ?If you are sexually active, practice safe sex. Use a condom or other form of protection in order to prevent STIs. ?Take aspirin only as told by  your health care provider. Make sure that you understand how much to take and what form to take. Work with your health care provider to find out whether it  is safe and beneficial for you to take aspirin daily. ?Ask your health care provider if you need to take a cholesterol-lowering medicine (statin). ?Find healthy ways to manage stress, such as: ?Meditation, yoga, or listening to music. ?Journaling. ?Talking to a trusted person. ?Spending time with friends and family. ?Minimize exposure to UV radiation to reduce your risk of skin cancer. ?Safety ?Always wear your seat belt while driving or riding in a vehicle. ?Do not drive: ?If you have been drinking alcohol. Do not ride with someone who has been drinking. ?When you are tired or distracted. ?While texting. ?If you have been using any mind-altering substances or drugs. ?Wear a helmet and other protective equipment during sports activities. ?If you have firearms in your house, make sure you follow all gun safety procedures. ?What's next? ?Visit your health care provider once a year for an annual wellness visit. ?Ask your health care provider how often you should have your eyes and teeth checked. ?Stay up to date on all vaccines. ?This information is not intended to replace advice given to you by your health care provider. Make sure you discuss any questions you have with your health care provider. ?Document Revised: 03/30/2021 Document Reviewed: 03/30/2021 ?Elsevier Patient Education ? [place]. ? ?

## 2022-03-09 ENCOUNTER — Ambulatory Visit: Payer: BC Managed Care – PPO | Admitting: Podiatry

## 2022-03-14 ENCOUNTER — Ambulatory Visit: Payer: Medicare PPO | Admitting: Podiatry

## 2022-03-14 DIAGNOSIS — D2370 Other benign neoplasm of skin of unspecified lower limb, including hip: Secondary | ICD-10-CM

## 2022-03-14 DIAGNOSIS — D2372 Other benign neoplasm of skin of left lower limb, including hip: Secondary | ICD-10-CM | POA: Diagnosis not present

## 2022-03-14 DIAGNOSIS — B070 Plantar wart: Secondary | ICD-10-CM | POA: Diagnosis not present

## 2022-03-14 NOTE — Progress Notes (Unsigned)
66 y.o. G63P0000 Married Caucasian female here for annual exam.    PCP:     Patient's last menstrual period was 10/17/2003 (approximate).           Sexually active: {yes no:314532}  The current method of family planning is post menopausal status.    Exercising: {yes no:314532}  {types:19826} Smoker:  no  Health Maintenance: Pap: 12-26-18 Neg:Neg HR HPV, 01-10-16 Neg:Neg HR HPV, 11-14-11 Neg History of abnormal Pap:  no MMG:  08-25-21 Neg/BiRads1 Colonoscopy:   06/19/18 f/u 10 years BMD:  07-15-19  Result :Normal TDaP:  01-09-13 Gardasil:   n/a HIV: no Hep C: 01-10-16 Neg Screening Labs:  Hb today: ***, Urine today: ***   reports that she has never smoked. She has never used smokeless tobacco. She reports that she does not currently use alcohol. She reports that she does not use drugs.  Past Medical History:  Diagnosis Date   Allergy    Cancer ([place]) 2006, 2020   basal call carcinoma - left upper side of thorax, right face   Cataract    are beginning    Hyperlipidemia    Osteopenia    Stress fracture 2019   foot    Past Surgical History:  Procedure Laterality Date   BREAST BIOPSY Right    BREAST CYST ASPIRATION     COLONOSCOPY     MANDIBLE SURGERY Bilateral 08/2012   NASAL SINUS SURGERY  2001   POLYPECTOMY     SKIN CANCER EXCISION     TONSILLECTOMY AND ADENOIDECTOMY  1967    Current Outpatient Medications  Medication Sig Dispense Refill   alendronate (FOSAMAX) 70 MG tablet Take 1 tablet (70 mg total) by mouth every 7 (seven) days. Take with a full glass of water on an empty stomach. 12 tablet 3   estradiol (ESTRACE) 0.1 MG/GM vaginal cream Use 1/2 g vaginally every night for the first 2 weeks, then use 1/2 g vaginally two or three times per week as needed to maintain symptom relief. 42.5 g 1   metroNIDAZOLE (METROCREAM) 0.75 % cream Apply topically 2 (two) times daily.     pravastatin (PRAVACHOL) 40 MG tablet Take 1 tablet (40 mg total) by mouth daily. 90 tablet 1    Current Facility-Administered Medications  Medication Dose Route Frequency Provider Last Rate Last Admin   0.9 %  sodium chloride infusion  500 mL Intravenous Once Nandigam, Venia Minks, MD        Family History  Problem Relation Age of Onset   Arthritis Mother    Hyperlipidemia Mother    Hypotension Mother    Cancer Mother 74       basal cell carcinoma   Macular degeneration Mother    Stroke Mother    Thyroid disease Mother    Immunodeficiency Mother    Heart block Mother    Hyperlipidemia Father    Hypertension Father    Diabetes Father    Stroke Father    Lung cancer Father    Colon polyps Father    Melanoma Father    Skin cancer Brother    Arthritis Brother    Heart disease Maternal Grandmother 77       Multiple heart attacks   Heart disease Maternal Grandfather 66       Aortic aneurysm   Breast cancer Maternal Aunt    Heart disease Maternal Aunt    Heart disease Maternal Uncle    Uterine cancer Paternal Aunt    Heart disease Paternal  Aunt    Colon cancer Paternal Uncle    Lung cancer Paternal Uncle        x's 2 smokers   Melanoma Paternal Uncle    Stroke Paternal Uncle    Heart Problems Paternal Uncle    Breast cancer Other        maternal female cousin   Esophageal cancer Neg Hx    Rectal cancer Neg Hx    Stomach cancer Neg Hx     Review of Systems  Exam:   LMP 10/17/2003 (Approximate)     General appearance: alert, cooperative and appears stated age Head: normocephalic, without obvious abnormality, atraumatic Neck: no adenopathy, supple, symmetrical, trachea midline and thyroid normal to inspection and palpation Lungs: clear to auscultation bilaterally Breasts: normal appearance, no masses or tenderness, No nipple retraction or dimpling, No nipple discharge or bleeding, No axillary adenopathy Heart: regular rate and rhythm Abdomen: soft, non-tender; no masses, no organomegaly Extremities: extremities normal, atraumatic, no cyanosis or edema Skin:  skin color, texture, turgor normal. No rashes or lesions Lymph nodes: cervical, supraclavicular, and axillary nodes normal. Neurologic: grossly normal  Pelvic: External genitalia:  no lesions              No abnormal inguinal nodes palpated.              Urethra:  normal appearing urethra with no masses, tenderness or lesions              Bartholins and Skenes: normal                 Vagina: normal appearing vagina with normal color and discharge, no lesions              Cervix: no lesions              Pap taken: {yes no:314532} Bimanual Exam:  Uterus:  normal size, contour, position, consistency, mobility, non-tender              Adnexa: no mass, fullness, tenderness              Rectal exam: {yes no:314532}.  Confirms.              Anus:  normal sphincter tone, no lesions  Chaperone was present for exam:  ***  Assessment:   Well woman visit with gynecologic exam.   Plan: Mammogram screening discussed. Self breast awareness reviewed. Pap and HR HPV as above. Guidelines for Calcium, Vitamin D, regular exercise program including cardiovascular and weight bearing exercise.   Follow up annually and prn.   Additional counseling given.  {yes [id number]. _______ minutes face to face time of which over 50% was spent in counseling.    After visit summary provided.

## 2022-03-14 NOTE — Patient Instructions (Signed)
Take dressing off in 8 hours and wash the foot with soap and water. If it is hurting or becomes uncomfortable before the 8 hours, go ahead and remove the bandage and wash the area.  If it blisters, apply antibiotic ointment and a band-aid.  Monitor for any signs/symptoms of infection. Call the office immediately if any occur or go directly to the emergency room. Call with any questions/concerns.   

## 2022-03-15 ENCOUNTER — Ambulatory Visit (INDEPENDENT_AMBULATORY_CARE_PROVIDER_SITE_OTHER): Payer: Medicare PPO | Admitting: Obstetrics and Gynecology

## 2022-03-15 ENCOUNTER — Encounter: Payer: Self-pay | Admitting: Podiatry

## 2022-03-15 ENCOUNTER — Other Ambulatory Visit (HOSPITAL_COMMUNITY)
Admission: RE | Admit: 2022-03-15 | Discharge: 2022-03-15 | Disposition: A | Discharge status: Home / Self Care | Payer: Medicare PPO | Source: Ambulatory Visit | Attending: Obstetrics and Gynecology | Admitting: Obstetrics and Gynecology

## 2022-03-15 ENCOUNTER — Encounter: Payer: Self-pay | Admitting: Obstetrics and Gynecology

## 2022-03-15 VITALS — BP 124/80 | HR 61 | Ht 63.5 in | Wt 145.0 lb

## 2022-03-15 DIAGNOSIS — M8580 Other specified disorders of bone density and structure, unspecified site: Secondary | ICD-10-CM | POA: Diagnosis not present

## 2022-03-15 DIAGNOSIS — Z79890 Hormone replacement therapy (postmenopausal): Secondary | ICD-10-CM

## 2022-03-15 DIAGNOSIS — Z124 Encounter for screening for malignant neoplasm of cervix: Secondary | ICD-10-CM

## 2022-03-15 DIAGNOSIS — Z01419 Encounter for gynecological examination (general) (routine) without abnormal findings: Secondary | ICD-10-CM | POA: Diagnosis not present

## 2022-03-15 DIAGNOSIS — Z1151 Encounter for screening for human papillomavirus (HPV): Secondary | ICD-10-CM | POA: Insufficient documentation

## 2022-03-15 MED ORDER — ESTRADIOL 0.1 MG/GM VA CREA: TOPICAL_CREAM | VAGINAL | 1 refills | Status: DC

## 2022-03-15 NOTE — Patient Instructions (Signed)

## 2022-03-15 NOTE — Progress Notes (Signed)
  Subjective:  Patient ID: Kaitlyn Perez, female    DOB: Sep 30, 1956,  MRN: [id number]  Chief Complaint  Patient presents with   Plantar Warts     possible wart or callus on sole of foot making it painful to walk    66 y.o. female presents with the above complaint. History confirmed with patient.  Returns for follow-up has 3 lesions now that are painful they did go away for some time but have returned  Objective:  Physical Exam: warm, good capillary refill, no trophic changes or ulcerative lesions, normal DP and PT pulses and normal sensory exam.  Bilaterally she has benign-appearing central nucleated skin lesion submetatarsal 1 which are painful on palpation.  Appears to be consistent with a deep involuting verruca plantaris, she has a new 1 on the plantar fifth metatarsal base Assessment:   1. Benign neoplasm of skin of lower extremity, unspecified laterality   2. Verruca plantaris      Plan:  Patient was evaluated and treated and all questions answered.  Discussed etiology and treatment of such lesions in detail with the patient as well as multiple treatment options including debridement and chemical treatment with salicylic acid.  We also discussed offloading the lesions with padding and possibly orthotic devices if this does not improve.  Recommended she use salicylic acid at home.  Following debridement of the lesions with a chisel blade I destroyed them using salicylic acid salinocaine ointment and covered a Band-Aid.  She will use repeat treatment at home which has resolved this previously.  Return if symptoms worsen or fail to improve.

## 2022-03-16 LAB — CYTOLOGY - PAP
Adequacy: ABSENT
Comment: NEGATIVE
Diagnosis: NEGATIVE
High risk HPV: NEGATIVE

## 2022-03-29 DIAGNOSIS — H2513 Age-related nuclear cataract, bilateral: Secondary | ICD-10-CM | POA: Diagnosis not present

## 2022-03-29 DIAGNOSIS — H43813 Vitreous degeneration, bilateral: Secondary | ICD-10-CM | POA: Diagnosis not present

## 2022-03-29 DIAGNOSIS — H524 Presbyopia: Secondary | ICD-10-CM | POA: Diagnosis not present

## 2022-03-29 DIAGNOSIS — H04123 Dry eye syndrome of bilateral lacrimal glands: Secondary | ICD-10-CM | POA: Diagnosis not present

## 2022-03-30 NOTE — Progress Notes (Signed)
Referring-Yvonne Carollee Herter DO Reason for referral-Bradycardia  HPI: 66 yo female for evaluation of bradycardia at request of Roma Schanz DO.  Patient denies dyspnea, chest pain, palpitations or syncope.  She has occasional dizziness with standing or changing positions.  Her heart rate was recently noted to be 48 and cardiology asked to evaluate.  Current Outpatient Medications  Medication Sig Dispense Refill   alendronate (FOSAMAX) 70 MG tablet Take 1 tablet (70 mg total) by mouth every 7 (seven) days. Take with a full glass of water on an empty stomach. 12 tablet 3   estradiol (ESTRACE) 0.1 MG/GM vaginal cream Use 1/2 g vaginally every night for the first 2 weeks, then use 1/2 g vaginally two or three times per week as needed to maintain symptom relief. 42.5 g 1   metroNIDAZOLE (METROCREAM) 0.75 % cream Apply topically 2 (two) times daily.     pravastatin (PRAVACHOL) 40 MG tablet Take 1 tablet (40 mg total) by mouth daily. 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 thorax, right face   Cataract    are beginning    Hyperlipidemia    Osteopenia    Stress fracture 2019   foot    Past Surgical History:  Procedure Laterality Date   BREAST BIOPSY Right    BREAST CYST ASPIRATION     COLONOSCOPY     MANDIBLE SURGERY Bilateral 08/2012   NASAL SINUS SURGERY  2001   POLYPECTOMY     SKIN CANCER EXCISION     TONSILLECTOMY AND ADENOIDECTOMY  1967    Social History   Socioeconomic History   Marital status: Married    Spouse name: Mackenzy Grumbine   Number of children: Not on file   Years of  education: 18   Highest education level: Not on file  Occupational History   Occupation: Advertising account planner VP    Employer: [employer]  Tobacco Use   Smoking status: Never   Smokeless tobacco: Never  Vaping Use   Vaping Use: Never used  Substance and Sexual Activity   Alcohol use: Not Currently   Drug use: No   Sexual activity: Not Currently    Partners: Male    Birth control/protection: Post-menopausal  Other Topics Concern   Not on file  Social History Narrative   Exercise--yes, at least 4 days a week   Social Determinants of Health   Financial Resource Strain: Not on file  Food Insecurity: Not on file  Transportation Needs: Not on file  Physical Activity: Not on file  Stress: Not on file  Social Connections: Not on file  Intimate Partner Violence: Not on file    Family History  Problem Relation Age of Onset   Arthritis Mother    Hyperlipidemia Mother    Hypotension Mother    Cancer Mother 49       basal cell carcinoma   Macular degeneration Mother    Stroke Mother    Thyroid disease Mother    Immunodeficiency Mother    Heart block Mother  Hyperlipidemia Father    Hypertension Father    Diabetes Father    Stroke Father    Lung cancer Father    Colon polyps Father    Melanoma Father    Skin cancer Brother    Arthritis Brother    Heart disease Maternal Grandmother 77       Multiple heart attacks   Heart disease Maternal Grandfather 85       Aortic aneurysm   Breast cancer Maternal Aunt    Heart disease Maternal Aunt    Heart disease Maternal Uncle    Uterine cancer Paternal Aunt    Heart disease Paternal Aunt    Colon cancer Paternal Uncle    Lung cancer Paternal Uncle        x's 2 smokers   Melanoma Paternal Uncle    Stroke Paternal Uncle    Heart Problems Paternal Uncle    Breast cancer Other        maternal female cousin   Esophageal cancer Neg Hx    Rectal cancer Neg Hx    Stomach cancer Neg Hx     ROS: no fevers or chills, productive cough,  hemoptysis, dysphasia, odynophagia, melena, hematochezia, dysuria, hematuria, rash, seizure activity, orthopnea, PND, pedal edema, claudication. Remaining systems are negative.  Physical Exam:   Blood pressure 130/90, pulse 62, height 5' 3.5" (1.613 m), weight 144 lb 0.6 oz (65.3 kg), last menstrual period 10/17/2003, SpO2 96 %.  General:  Well developed/well nourished in NAD Skin warm/dry Patient not depressed No peripheral clubbing Back-normal HEENT-normal/normal eyelids Neck supple/normal carotid upstroke bilaterally; no bruits; no JVD; no thyromegaly chest - CTA/ normal expansion CV - RRR/normal S1 and S2; no murmurs, rubs or gallops;  PMI nondisplaced Abdomen -NT/ND, no HSM, no mass, + bowel sounds, no bruit 2+ femoral pulses, no bruits Ext-no edema, chords, 2+ DP Neuro-grossly nonfocal  ECG - 02/14/22-sinus bradycardia (48); first degree AV block; personally reviewed  A/P  1 Bradycardia-patient has a sinus bradycardia with a first-degree AV block.  However she is not having symptoms including no presyncope or syncope.  We will follow.  It is noteworthy that both of her parents have pacemakers.  I will arrange an echocardiogram to assess LV function.  2 Hyperlipidemia-laboratories from May 2023 reviewed and shows LDL 112 with HDL 97.  Continue pravastatin.  She has no documented vascular disease.  Kirk Ruths, MD

## 2022-04-12 ENCOUNTER — Encounter: Payer: Self-pay | Admitting: Cardiology

## 2022-04-12 ENCOUNTER — Ambulatory Visit: Payer: Medicare PPO | Admitting: Cardiology

## 2022-04-12 VITALS — BP 130/90 | HR 62 | Ht 63.5 in | Wt 144.0 lb

## 2022-04-12 DIAGNOSIS — E7800 Pure hypercholesterolemia, unspecified: Secondary | ICD-10-CM

## 2022-04-12 DIAGNOSIS — R001 Bradycardia, unspecified: Secondary | ICD-10-CM

## 2022-04-12 NOTE — Patient Instructions (Signed)
  Testing/Procedures:  Your physician has requested that you have an echocardiogram. Echocardiography is a painless test that uses sound waves to create images of your heart. It provides your doctor with information about the size and shape of your heart and how well your heart's chambers and valves are working. This procedure takes approximately one hour. There are no restrictions for this procedure. [place] [facility]-1ST FLOOR IMAGING DEPARTMENT   Follow-Up: At [facility], you and your health needs are our priority.  As part of our continuing mission to provide you with exceptional heart care, we have created designated Provider Care Teams.  These Care Teams include your primary Cardiologist (physician) and Advanced Practice Providers (APPs -  Physician Assistants and Nurse Practitioners) who all work together to provide you with the care you need, when you need it.  We recommend signing up for the patient portal called "MyChart".  Sign up information is provided on this After Visit Summary.  MyChart is used to connect with patients for Virtual Visits (Telemedicine).  Patients are able to view lab/test results, encounter notes, upcoming appointments, etc.  Non-urgent messages can be sent to your provider as well.   To learn more about what you can do with MyChart, go to [email or web address].    Your next appointment:   12 month(s)  The format for your next appointment:   In Person  Provider:   Kirk Ruths, MD{   Important Information About Sugar

## 2022-04-19 ENCOUNTER — Ambulatory Visit (HOSPITAL_BASED_OUTPATIENT_CLINIC_OR_DEPARTMENT_OTHER)
Admission: RE | Admit: 2022-04-19 | Discharge: 2022-04-19 | Disposition: A | Discharge status: Home / Self Care | Payer: Medicare PPO | Source: Ambulatory Visit | Attending: Cardiology | Admitting: Cardiology

## 2022-04-19 DIAGNOSIS — R001 Bradycardia, unspecified: Secondary | ICD-10-CM | POA: Diagnosis not present

## 2022-04-19 NOTE — Progress Notes (Signed)
  Echocardiogram 2D Echocardiogram has been performed.  [name] F 04/19/2022, 2:11 PM

## 2022-04-20 LAB — ECHOCARDIOGRAM COMPLETE
AR max vel: 1.42 cm2
AV Area VTI: 1.62 cm2
AV Area mean vel: 1.5 cm2
AV Mean grad: 4 mmHg
AV Peak grad: 6.6 mmHg
Ao pk vel: 1.28 m/s
Area-P 1/2: 5.23 cm2
S' Lateral: 2.6 cm

## 2022-05-24 ENCOUNTER — Other Ambulatory Visit: Payer: Self-pay | Admitting: Family Medicine

## 2022-05-24 DIAGNOSIS — E785 Hyperlipidemia, unspecified: Secondary | ICD-10-CM

## 2022-07-25 DIAGNOSIS — L820 Inflamed seborrheic keratosis: Secondary | ICD-10-CM | POA: Diagnosis not present

## 2022-07-31 ENCOUNTER — Other Ambulatory Visit: Payer: Self-pay | Admitting: Family Medicine

## 2022-07-31 ENCOUNTER — Telehealth: Payer: Self-pay | Admitting: Family Medicine

## 2022-07-31 DIAGNOSIS — Z1231 Encounter for screening mammogram for malignant neoplasm of breast: Secondary | ICD-10-CM

## 2022-07-31 NOTE — Telephone Encounter (Signed)
Pt has an appt with [place] on 10/31 for the flu shot, and she would like to know if she is due for anything else.

## 2022-07-31 NOTE — Telephone Encounter (Signed)
I have called the pt and and informed her that the only other vaccines that are recommended are the RSV and covid vaccines. We only carry the flu vaccine in the clinic and the other vaccines are at the pharmacy. Pt stated understanding.

## 2022-08-15 ENCOUNTER — Ambulatory Visit (INDEPENDENT_AMBULATORY_CARE_PROVIDER_SITE_OTHER): Payer: Medicare PPO

## 2022-08-15 DIAGNOSIS — Z23 Encounter for immunization: Secondary | ICD-10-CM

## 2022-08-15 NOTE — Progress Notes (Signed)
Here for high dose flu shot

## 2022-08-25 ENCOUNTER — Other Ambulatory Visit (HOSPITAL_BASED_OUTPATIENT_CLINIC_OR_DEPARTMENT_OTHER): Payer: Self-pay

## 2022-08-25 MED ORDER — COMIRNATY 30 MCG/0.3ML IM SUSY
PREFILLED_SYRINGE | INTRAMUSCULAR | 0 refills | Status: DC
  Filled 2022-08-25: qty 0.3, 1d supply, fill #0

## 2022-09-15 ENCOUNTER — Ambulatory Visit
Admission: RE | Admit: 2022-09-15 | Discharge: 2022-09-15 | Disposition: A | Discharge status: Home / Self Care | Payer: Medicare PPO | Source: Ambulatory Visit | Attending: Family Medicine | Admitting: Family Medicine

## 2022-09-15 DIAGNOSIS — Z1231 Encounter for screening mammogram for malignant neoplasm of breast: Secondary | ICD-10-CM | POA: Diagnosis not present

## 2022-09-18 ENCOUNTER — Telehealth: Payer: Self-pay | Admitting: Family Medicine

## 2022-09-18 NOTE — Telephone Encounter (Signed)
Copied from [place] [id number]. Topic: Medicare AWV >> Sep 18, 2022  9:38 AM Gillis Santa wrote: Reason for CRM: LVM FOR PATIENT TO CALL KAREN [phone] TO SCHEDULE AWVI [place]

## 2022-11-27 ENCOUNTER — Encounter: Payer: Self-pay | Admitting: Family Medicine

## 2022-12-12 ENCOUNTER — Ambulatory Visit: Payer: Medicare PPO | Admitting: Family Medicine

## 2022-12-12 ENCOUNTER — Encounter: Payer: Self-pay | Admitting: Family Medicine

## 2022-12-12 VITALS — BP 126/86 | HR 51 | Temp 97.8°F | Resp 18 | Ht 63.5 in | Wt 143.2 lb

## 2022-12-12 DIAGNOSIS — M8580 Other specified disorders of bone density and structure, unspecified site: Secondary | ICD-10-CM

## 2022-12-12 DIAGNOSIS — E785 Hyperlipidemia, unspecified: Secondary | ICD-10-CM | POA: Diagnosis not present

## 2022-12-12 DIAGNOSIS — E7849 Other hyperlipidemia: Secondary | ICD-10-CM

## 2022-12-12 MED ORDER — PRAVASTATIN SODIUM 40 MG PO TABS: ORAL_TABLET | ORAL | 1 refills | Status: DC

## 2022-12-12 MED ORDER — ALENDRONATE SODIUM 70 MG PO TABS: 70.0000 mg | ORAL_TABLET | ORAL | 3 refills | Status: DC

## 2022-12-12 MED ORDER — FLUTICASONE PROPIONATE 50 MCG/ACT NA SUSP: 2.0000 | Freq: Every day | NASAL | 6 refills | Status: DC

## 2022-12-12 MED ORDER — VITAMIN D3 25 MCG (1000 UT) PO CAPS: 1000.0000 [IU] | ORAL_CAPSULE | Freq: Every day | ORAL | 0 refills | Status: DC

## 2022-12-12 MED ORDER — CALCIUM CARB-CHOLECALCIFEROL 500-5 MG-MCG PO TABS: ORAL_TABLET | ORAL | Status: AC

## 2022-12-12 MED ORDER — AZELASTINE HCL 0.1 % NA SOLN: 1.0000 | Freq: Two times a day (BID) | NASAL | 12 refills | Status: DC

## 2022-12-12 NOTE — Assessment & Plan Note (Signed)
Tolerating statin, encouraged heart healthy diet, avoid trans fats, minimize simple carbs and saturated fats. Increase exercise as tolerated  Con't pravastatin

## 2022-12-12 NOTE — Progress Notes (Signed)
Subjective:   By signing my name below, I, Kaitlyn Perez, attest that this documentation has been prepared under the direction and in the presence of Ann Held, DO. 12/12/2022   Patient ID: Kaitlyn Perez, female    DOB: 1955/11/14, 67 y.o.   MRN: [id number]  Chief Complaint  Patient presents with   Hyperlipidemia   Follow-up    Hyperlipidemia Pertinent negatives include no chest pain or shortness of breath.   Patient is in today for a follow up visit.   She complains of a bump developing below her left elbow. She notes suffering a bug bite which healed but soon found the bump developed after. It is non-tender.  She also complains of her allergies flaring up. She was sneezing and itching most of yesterday. She is taking zyrtec and found mild relief. She notes her symptoms have been slowly building up and worsening since she last contracted Covid-19.  She is requesting a refill on 40 mg pravastatin and 70 mg Fosamax.  She continues taking calcium supplements regularly.  She continues occasionally applying metrocream.  She has 5 Covid-19 vaccines. She is UTD on the pneumonia vaccine and the shinlges vaccine. She is eligible for the RSV vaccine. She is due for a the tetanus vaccine next month and is interested in receiving it next month.  Her next bone density is due next year.  She is not exercising regularly at this time.   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, right face   Cataract    are beginning    Hyperlipidemia    Osteopenia    Stress fracture 2019   foot    Past Surgical History:  Procedure Laterality Date   BREAST BIOPSY Right    BREAST CYST ASPIRATION     COLONOSCOPY     MANDIBLE SURGERY Bilateral 08/2012   NASAL SINUS SURGERY  2001   POLYPECTOMY     SKIN CANCER EXCISION     TONSILLECTOMY AND ADENOIDECTOMY  1967    Family History  Problem Relation Age of Onset    Arthritis Mother    Hyperlipidemia Mother    Hypotension Mother    Cancer Mother 98       basal cell carcinoma   Macular degeneration Mother    Stroke Mother    Thyroid disease Mother    Immunodeficiency Mother    Heart block Mother    Hyperlipidemia Father    Hypertension Father    Diabetes Father    Stroke Father    Lung cancer Father    Colon polyps Father    Melanoma Father    Skin cancer Brother    Arthritis Brother    Heart disease Maternal Grandmother 77       Multiple heart attacks   Heart disease Maternal Grandfather 65       Aortic aneurysm   Breast cancer Maternal Aunt    Heart disease Maternal Aunt    Heart disease Maternal Uncle    Uterine cancer Paternal Aunt    Heart disease Paternal Aunt    Colon cancer Paternal Uncle    Lung cancer Paternal Uncle        x's 2 smokers   Melanoma Paternal Uncle    Stroke Paternal Uncle    Heart Problems Paternal Uncle    Breast cancer Other  maternal female cousin   Esophageal cancer Neg Hx    Rectal cancer Neg Hx    Stomach cancer Neg Hx     Social History   Socioeconomic History   Marital status: Married    Spouse name: Kaitlyn Perez   Number of children: Not on file   Years of education: 18   Highest education level: Not on file  Occupational History   Occupation: Advertising account planner VP    Employer: [employer]  Tobacco Use   Smoking status: Never   Smokeless tobacco: Never  Vaping Use   Vaping Use: Never used  Substance and Sexual Activity   Alcohol use: Not Currently   Drug use: No   Sexual activity: Not Currently    Partners: Male    Birth control/protection: Post-menopausal  Other Topics Concern   Not on file  Social History Narrative   Exercise--yes, at least 4 days a week   Social Determinants of Health   Financial Resource Strain: Not on file  Food Insecurity: Not on file  Transportation Needs: Not on file  Physical Activity: Not on file  Stress: Not on file  Social Connections: Not on  file  Intimate Partner Violence: Not on file    Outpatient Medications Prior to Visit  Medication Sig Dispense Refill   estradiol (ESTRACE) 0.1 MG/GM vaginal cream Use 1/2 g vaginally every night for the first 2 weeks, then use 1/2 g vaginally two or three times per week as needed to maintain symptom relief. 42.5 g 1   metroNIDAZOLE (METROCREAM) 0.75 % cream Apply topically 2 (two) times daily.     alendronate (FOSAMAX) 70 MG tablet Take 1 tablet (70 mg total) by mouth every 7 (seven) days. Take with a full glass of water on an empty stomach. 12 tablet 3   pravastatin (PRAVACHOL) 40 MG tablet TAKE ONE (1) TABLET BY MOUTH EACH DAY 90 tablet 1   COVID-19 mRNA vaccine 2023-2024 (COMIRNATY) syringe Inject into the muscle. 0.3 mL 0   Facility-Administered Medications Prior to Visit  Medication Dose Route Frequency Provider Last Rate Last Admin   0.9 %  sodium chloride infusion  500 mL Intravenous Once Nandigam, Venia Minks, MD        Allergies  Allergen Reactions   Neosporin [Neomycin-Polymyxin-Gramicidin] Rash    Local rash    Review of Systems  Constitutional:  Negative for fever and malaise/fatigue.  HENT:  Negative for congestion.        (+)sneezing  Eyes:  Negative for blurred vision.  Respiratory:  Negative for shortness of breath.   Cardiovascular:  Negative for chest pain, palpitations and leg swelling.  Gastrointestinal:  Negative for abdominal pain, blood in stool and nausea.  Genitourinary:  Negative for dysuria and frequency.  Musculoskeletal:  Negative for falls.  Skin:  Negative for rash.       (+)lump below left elbow (+)general itching  Neurological:  Negative for dizziness, loss of consciousness and headaches.  Endo/Heme/Allergies:  Negative for environmental allergies.  Psychiatric/Behavioral:  Negative for depression. The patient is not nervous/anxious.        Objective:    Physical Exam Vitals and nursing note reviewed.  Constitutional:      General: She is  not in acute distress.    Appearance: Normal appearance. She is well-developed. She is not ill-appearing.  HENT:     Head: Normocephalic and atraumatic.     Right Ear: External ear normal.     Left Ear: External ear  normal.  Eyes:     Extraocular Movements: Extraocular movements intact.     Conjunctiva/sclera: Conjunctivae normal.     Pupils: Pupils are equal, round, and reactive to light.  Neck:     Thyroid: No thyromegaly.     Vascular: No carotid bruit or JVD.  Cardiovascular:     Rate and Rhythm: Normal rate and regular rhythm.     Heart sounds: Normal heart sounds. No murmur heard.    No gallop.  Pulmonary:     Effort: Pulmonary effort is normal. No respiratory distress.     Breath sounds: Normal breath sounds. No wheezing or rales.  Chest:     Chest wall: No tenderness.  Musculoskeletal:     Cervical back: Normal range of motion and neck supple.  Skin:    General: Skin is warm and dry.  Neurological:     Mental Status: She is alert and oriented to person, place, and time.  Psychiatric:        Judgment: Judgment normal.     BP 126/86 (BP Location: Left Arm, Patient Position: Sitting, Cuff Size: Normal)   Pulse (!) 51   Temp 97.8 F (36.6 C) (Oral)   Resp 18   Ht 5' 3.5" (1.613 m)   Wt 143 lb 3.2 oz (65 kg)   LMP 10/17/2003 (Approximate)   SpO2 99%   BMI 24.97 kg/m  Wt Readings from Last 3 Encounters:  12/12/22 143 lb 3.2 oz (65 kg)  04/12/22 144 lb 0.6 oz (65.3 kg)  03/15/22 145 lb (65.8 kg)       Assessment & Plan:  Other hyperlipidemia Assessment & Plan: Tolerating statin, encouraged heart healthy diet, avoid trans fats, minimize simple carbs and saturated fats. Increase exercise as tolerated  Con't pravastatin   Orders: -     CBC with Differential/Platelet -     Comprehensive metabolic panel -     Lipid panel  Osteopenia, unspecified location -     Alendronate Sodium; Take 1 tablet (70 mg total) by mouth every 7 (seven) days. Take with a full  glass of water on an empty stomach.  Dispense: 12 tablet; Refill: 3 -     Comprehensive metabolic panel -     Vitamin D3; Take 1 capsule (1,000 Units total) by mouth daily.  Dispense: 30 capsule; Refill: 0  Hyperlipidemia, unspecified hyperlipidemia type Assessment & Plan: Tolerating statin, encouraged heart healthy diet, avoid trans fats, minimize simple carbs and saturated fats. Increase exercise as tolerated  Con't pravastatin   Orders: -     Pravastatin Sodium; TAKE ONE (1) TABLET BY MOUTH EACH DAY  Dispense: 90 tablet; Refill: 1  Other orders -     Calcium Carb-Cholecalciferol; 3 po qd -     Fluticasone Propionate; Place 2 sprays into both nostrils daily.  Dispense: 16 g; Refill: 6 -     Azelastine HCl; Place 1 spray into both nostrils 2 (two) times daily. Use in each nostril as directed  Dispense: 30 mL; Refill: 12    I, Ann Held, DO, personally preformed the services described in this documentation.  All medical record entries made by the scribe were at my direction and in my presence.  I have reviewed the chart and discharge instructions (if applicable) and agree that the record reflects my personal performance and is accurate and complete. 12/12/2022   I,Kaitlyn Perez,acting as a scribe for Ann Held, DO.,have documented all relevant documentation on the behalf of Kaitlyn Perez  Kaitlyn Herter, DO,as directed by  Ann Held, DO while in the presence of Ann Held, DO.   Ann Held, DO

## 2022-12-13 ENCOUNTER — Encounter: Payer: Self-pay | Admitting: Family Medicine

## 2022-12-13 LAB — COMPREHENSIVE METABOLIC PANEL
ALT: 15 U/L (ref 0–35)
AST: 17 U/L (ref 0–37)
Albumin: 4.3 g/dL (ref 3.5–5.2)
Alkaline Phosphatase: 37 U/L — ABNORMAL LOW (ref 39–117)
BUN: 17 mg/dL (ref 6–23)
CO2: 28 mEq/L (ref 19–32)
Calcium: 10 mg/dL (ref 8.4–10.5)
Chloride: 105 mEq/L (ref 96–112)
Creatinine, Ser: 0.7 mg/dL (ref 0.40–1.20)
GFR: 90.07 mL/min (ref >60.00)
Glucose, Bld: 69 mg/dL — ABNORMAL LOW (ref 70–99)
Potassium: 4 mEq/L (ref 3.5–5.1)
Sodium: 142 mEq/L (ref 135–145)
Total Bilirubin: 0.6 mg/dL (ref 0.2–1.2)
Total Protein: 6.8 g/dL (ref 6.0–8.3)

## 2022-12-13 LAB — CBC WITH DIFFERENTIAL/PLATELET
Basophils Absolute: 0 10*3/uL (ref 0.0–0.1)
Basophils Relative: 0.8 % (ref 0.0–3.0)
Eosinophils Absolute: 0.1 10*3/uL (ref 0.0–0.7)
Eosinophils Relative: 1 % (ref 0.0–5.0)
HCT: 42.8 % (ref 36.0–46.0)
Hemoglobin: 14.2 g/dL (ref 12.0–15.0)
Lymphocytes Relative: 22.2 % (ref 12.0–46.0)
Lymphs Abs: 1.3 10*3/uL (ref 0.7–4.0)
MCHC: 33.3 g/dL (ref 30.0–36.0)
MCV: 95.8 fl (ref 78.0–100.0)
Monocytes Absolute: 0.6 10*3/uL (ref 0.1–1.0)
Monocytes Relative: 10.4 % (ref 3.0–12.0)
Neutro Abs: 3.9 10*3/uL (ref 1.4–7.7)
Neutrophils Relative %: 65.6 % (ref 43.0–77.0)
Platelets: 268 10*3/uL (ref 150.0–400.0)
RBC: 4.47 Mil/uL (ref 3.87–5.11)
RDW: 13.6 % (ref 11.5–15.5)
WBC: 5.9 10*3/uL (ref 4.0–10.5)

## 2022-12-13 LAB — LIPID PANEL
Cholesterol: 228 mg/dL — ABNORMAL HIGH (ref 0–200)
HDL: 92.7 mg/dL (ref >39.00)
LDL Cholesterol: 116 mg/dL — ABNORMAL HIGH (ref 0–99)
NonHDL: 135.57
Total CHOL/HDL Ratio: 2
Triglycerides: 96 mg/dL (ref 0.0–149.0)
VLDL: 19.2 mg/dL (ref 0.0–40.0)

## 2022-12-19 MED ORDER — PRAVASTATIN SODIUM 80 MG PO TABS: 80.0000 mg | ORAL_TABLET | Freq: Every day | ORAL | 2 refills | Status: DC

## 2023-01-01 ENCOUNTER — Telehealth: Payer: Self-pay | Admitting: Family Medicine

## 2023-01-01 NOTE — Telephone Encounter (Signed)
Contacted Kaitlyn Perez to schedule their annual wellness visit. Patient declined to schedule AWV at this time. Stated she see no benefit doing the [place]; [place] Direct Dial: [phone]

## 2023-01-30 DIAGNOSIS — L820 Inflamed seborrheic keratosis: Secondary | ICD-10-CM | POA: Diagnosis not present

## 2023-01-30 DIAGNOSIS — D1722 Benign lipomatous neoplasm of skin and subcutaneous tissue of left arm: Secondary | ICD-10-CM | POA: Diagnosis not present

## 2023-02-21 ENCOUNTER — Other Ambulatory Visit (HOSPITAL_BASED_OUTPATIENT_CLINIC_OR_DEPARTMENT_OTHER): Payer: Self-pay

## 2023-02-21 MED ORDER — BOOSTRIX 5-2.5-18.5 LF-MCG/0.5 IM SUSY
0.5000 mL | PREFILLED_SYRINGE | Freq: Once | INTRAMUSCULAR | 0 refills | Status: AC
  Filled 2023-02-21: qty 0.5, 1d supply, fill #0

## 2023-03-06 DIAGNOSIS — L308 Other specified dermatitis: Secondary | ICD-10-CM | POA: Diagnosis not present

## 2023-03-06 DIAGNOSIS — D1722 Benign lipomatous neoplasm of skin and subcutaneous tissue of left arm: Secondary | ICD-10-CM | POA: Diagnosis not present

## 2023-03-07 NOTE — Progress Notes (Signed)
67 y.o. G0P0000 Married Caucasian female here for 1 yr med check.    Patient is followed for vaginal atrophy and treatment with vaginal estrogen cream.  Using 1 gram once a week.  She prefers Premarin vaginal cream.  She finds the estradiol cream to be messy.   Not sexually active.   PCP:   Dr. Laury Axon  Patient's last menstrual period was 10/17/2003 (approximate).           Sexually active: No.  The current method of family planning is post menopausal status.    Exercising: No. Smoker:  no  Health Maintenance: Pap:  03/15/22 neg: HR HPV neg, 12-26-18 Neg:Neg HR HPV, 01-10-16 Neg:Neg HR HPV, 11-14-11 Neg  History of abnormal Pap:  no MMG:  09/15/22 Breast Density Cat B, BI-RADS CAT 1 neg Colonoscopy:  06/19/18 BMD:   08/25/21  Result  osteopenia - PCP following.  TDaP:  01/09/13 - Just had this in April or May, 2024.  Gardasil:   no HIV: n/a Hep C: 01/10/16 neg Screening Labs:  PCP   reports that she has never smoked. She has never used smokeless tobacco. She reports that she does not currently use alcohol. She reports that she does not use drugs.  Past Medical History:  Diagnosis Date   Allergy    Cancer ([facility]) 2006, 2020   basal call carcinoma - left upper side of thorax, right face   Cataract    are beginning    Hyperlipidemia    Osteopenia    Stress fracture 2019   foot    Past Surgical History:  Procedure Laterality Date   BREAST BIOPSY Right    BREAST CYST ASPIRATION     COLONOSCOPY     MANDIBLE SURGERY Bilateral 08/2012   NASAL SINUS SURGERY  2001   POLYPECTOMY     SKIN CANCER EXCISION     TONSILLECTOMY AND ADENOIDECTOMY  1967    Current Outpatient Medications  Medication Sig Dispense Refill   alendronate (FOSAMAX) 70 MG tablet Take 1 tablet (70 mg total) by mouth every 7 (seven) days. Take with a full glass of water on an empty stomach. 12 tablet 3   azelastine (ASTELIN) 0.1 % nasal spray Place 1 spray into both nostrils 2 (two) times daily. Use in each nostril  as directed 30 mL 12   Calcium Carb-Cholecalciferol (CALCIUM 500 + D) 500-5 MG-MCG TABS 3 po qd     Cholecalciferol (VITAMIN D3) 25 MCG (1000 UT) capsule Take 1 capsule (1,000 Units total) by mouth daily. 30 capsule 0   estradiol (ESTRACE) 0.1 MG/GM vaginal cream Use 1/2 g vaginally every night for the first 2 weeks, then use 1/2 g vaginally two or three times per week as needed to maintain symptom relief. 42.5 g 1   pravastatin (PRAVACHOL) 80 MG tablet Take 1 tablet (80 mg total) by mouth daily. 30 tablet 2   metroNIDAZOLE (METROCREAM) 0.75 % cream Apply topically 2 (two) times daily. (Patient not taking: Reported on 03/21/2023)     Current Facility-Administered Medications  Medication Dose Route Frequency Provider Last Rate Last Admin   0.9 %  sodium chloride infusion  500 mL Intravenous Once Nandigam, Eleonore Chiquito, MD        Family History  Problem Relation Age of Onset   Arthritis Mother    Hyperlipidemia Mother    Hypotension Mother    Cancer Mother 74       basal cell carcinoma   Macular degeneration Mother  Stroke Mother    Thyroid disease Mother    Immunodeficiency Mother    Heart block Mother    Hyperlipidemia Father    Hypertension Father    Diabetes Father    Stroke Father    Lung cancer Father    Colon polyps Father    Melanoma Father    Skin cancer Brother    Arthritis Brother    Heart disease Maternal Grandmother 40       Multiple heart attacks   Heart disease Maternal Grandfather 36       Aortic aneurysm   Breast cancer Maternal Aunt    Heart disease Maternal Aunt    Heart disease Maternal Uncle    Uterine cancer Paternal Aunt    Heart disease Paternal Aunt    Colon cancer Paternal Uncle    Lung cancer Paternal Uncle        x's 2 smokers   Melanoma Paternal Uncle    Stroke Paternal Uncle    Heart Problems Paternal Uncle    Breast cancer Other        maternal female cousin   Esophageal cancer Neg Hx    Rectal cancer Neg Hx    Stomach cancer Neg Hx      Review of Systems  All other systems reviewed and are negative.   Exam:   BP 114/76 (BP Location: Right Arm, Patient Position: Sitting, Cuff Size: Normal)   Ht 5' 3.5" (1.613 m)   Wt 143 lb (64.9 kg)   LMP 10/17/2003 (Approximate)   BMI 24.93 kg/m     General appearance: alert, cooperative and appears stated age Head: normocephalic, without obvious abnormality, atraumatic Neck: no adenopathy, supple, symmetrical, trachea midline and thyroid normal to inspection and palpation Lungs: clear to auscultation bilaterally Breasts: normal appearance, no masses or tenderness, No nipple retraction or dimpling, No nipple discharge or bleeding, No axillary adenopathy Heart: regular rate and rhythm Abdomen: soft, non-tender; no masses, no organomegaly Extremities: extremities normal, atraumatic, no cyanosis or edema Skin: skin color, texture, turgor normal. No rashes or lesions Lymph nodes: cervical, supraclavicular, and axillary nodes normal. Neurologic: grossly normal  Pelvic: External genitalia:  no lesions              No abnormal inguinal nodes palpated.              Urethra:  normal appearing urethra with no masses, tenderness or lesions              Bartholins and Skenes: normal                 Vagina: normal appearing vagina with normal color and discharge, no lesions              Cervix: no lesions              Bimanual Exam:  Uterus:  normal size, contour, position, consistency, mobility, non-tender              Adnexa: no mass, fullness, tenderness       Chaperone was present for exam:  Warren Lacy, CMA  Assessment:    Vaginal atrophy.  Encounter for medication monitoring.   Plan: Mammogram screening discussed. Self breast awareness reviewed. Stop estradiol cream.  Vagifem 10 mcg pv twice a week at hs.  #24, RF 3.  Potential effect on breast cancer discussed.  Follow up annually and prn.   After visit summary provided.   22 min  total time was spent  for this patient  encounter, including preparation, face-to-face counseling with the patient, coordination of care, and documentation of the encounter.

## 2023-03-13 ENCOUNTER — Telehealth: Payer: Self-pay | Admitting: Family Medicine

## 2023-03-13 MED ORDER — PRAVASTATIN SODIUM 80 MG PO TABS: 80.0000 mg | ORAL_TABLET | Freq: Every day | ORAL | 2 refills | Status: DC

## 2023-03-13 NOTE — Telephone Encounter (Signed)
Prescription Request  03/13/2023  Is this a "Controlled Substance" medicine? No  LOV: 12/12/2022  What is the name of the medication or equipment?   pravastatin (PRAVACHOL) 80 MG tablet [[id number]]   Have you contacted your pharmacy to request a refill? No   Which pharmacy would you like this sent to?  [facility] - [place], [place] - [address] [place] [address] Phone: [phone] Fax: [phone]    Patient notified that their request is being sent to the clinical staff for review and that they should receive a response within 2 business days.   Please advise at Mobile [phone] (mobile)

## 2023-03-13 NOTE — Addendum Note (Signed)
Addended by: Roxanne Gates on: 03/13/2023 09:46 AM   Modules accepted: Orders

## 2023-03-13 NOTE — Telephone Encounter (Signed)
Refill sent.

## 2023-03-21 ENCOUNTER — Ambulatory Visit: Payer: Medicare PPO | Admitting: Obstetrics and Gynecology

## 2023-03-21 ENCOUNTER — Encounter: Payer: Self-pay | Admitting: Obstetrics and Gynecology

## 2023-03-21 VITALS — BP 114/76 | Ht 63.5 in | Wt 143.0 lb

## 2023-03-21 DIAGNOSIS — Z5181 Encounter for therapeutic drug level monitoring: Secondary | ICD-10-CM

## 2023-03-21 DIAGNOSIS — N952 Postmenopausal atrophic vaginitis: Secondary | ICD-10-CM | POA: Diagnosis not present

## 2023-03-21 MED ORDER — ESTRADIOL 10 MCG VA TABS: 1.0000 | ORAL_TABLET | VAGINAL | 3 refills | Status: DC

## 2023-03-28 ENCOUNTER — Encounter: Payer: Self-pay | Admitting: Podiatry

## 2023-03-28 ENCOUNTER — Ambulatory Visit: Payer: Medicare PPO | Admitting: Podiatry

## 2023-03-28 ENCOUNTER — Ambulatory Visit (INDEPENDENT_AMBULATORY_CARE_PROVIDER_SITE_OTHER): Payer: Medicare PPO

## 2023-03-28 DIAGNOSIS — S9032XA Contusion of left foot, initial encounter: Secondary | ICD-10-CM

## 2023-03-28 NOTE — Progress Notes (Signed)
  Subjective:  Patient ID: Kaitlyn Perez, female    DOB: 1956/09/22,   MRN: [id number]  Chief Complaint  Patient presents with   Foot Injury    Left foot injury happened 5 days ago    67 y.o. female presents for left foot injury that occurred 5 days ago. Relates she was up an night and stepped on the transition piece of her floor and had immediate pain on the outside of her foot nears the third and fourth toes. Relates she hurts any time she puts pressure on the area.  . Denies any other pedal complaints. Denies n/v/f/c.   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 CFT <3 seconds. Normal hair growth on digits. No edema.  Skin. No lacerations or abrasions bilateral feet.  Musculoskeletal: MMT 5/5 bilateral lower extremities in DF, PF, Inversion and Eversion. Deceased ROM in DF of ankle joint. Minimally tender around the third fourth and fifth metatarsal areas.  Neurological: Sensation intact to light touch.   Assessment:   1. Contusion of left foot, initial encounter      Plan:  Patient was evaluated and treated and all questions answered. -Xrays reviewed. No acute fractures or dislocations noted some cystic like change noted at fifth metatarsal head and second metatarsal head favored to be chronic changes.  -Discussed treatement options for left foot injury and contusion; risks, alternatives, and benefits explained. -Discussed wearing supportive tennis shoe for now.  -Recommend protection, rest, ice, elevation daily until symptoms improve -Rx pain med/antinflammatories as needed -Patient to return to office in 4 weeks for serial x-rays to assess healing  or sooner if condition worsens.    Louann Sjogren, DPM

## 2023-04-30 ENCOUNTER — Encounter: Payer: Self-pay | Admitting: Podiatry

## 2023-04-30 ENCOUNTER — Ambulatory Visit: Payer: Medicare PPO | Admitting: Podiatry

## 2023-04-30 DIAGNOSIS — S9032XD Contusion of left foot, subsequent encounter: Secondary | ICD-10-CM | POA: Diagnosis not present

## 2023-04-30 NOTE — Progress Notes (Signed)
  Subjective:  Patient ID: Kaitlyn Perez, female    DOB: 1956-01-04,   MRN: [id number]  Chief Complaint  Patient presents with   Foot Pain    F/U - left foot has started to hurt ,swelling on the top since about 2 weeks but is not hurting today,no pain on bottom of foot anymore.    67 y.o. female presents for follow-up of left foot injury. Relates swelling on the top for about two weeks but not currently hurting . Relates initial injury happened at  night and stepped on the transition piece of her floor and had immediate pain on the outside of her foot nears the third and fourth toes.  . Denies any other pedal complaints. Denies n/v/f/c.   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 CFT <3 seconds. Normal hair growth on digits. No edema.  Skin. No lacerations or abrasions bilateral feet.  Musculoskeletal: MMT 5/5 bilateral lower extremities in DF, PF, Inversion and Eversion. Deceased ROM in DF of ankle joint. Minimally tender around the third fourth and fifth metatarsal areas. Most tender over the fourth metatarsal dorsally today.  Neurological: Sensation intact to light touch.   Assessment:   1. Contusion of left foot, subsequent encounter       Plan:  Patient was evaluated and treated and all questions answered. -Xrays reviewed. No acute fractures or dislocations noted some cystic like change noted at fifth metatarsal head and second metatarsal head favored to be chronic changes.  -Discussed treatement options for left foot injury vs possible stress reaction.  and contusion; risks, alternatives, and benefits explained. -Given continued pain will plan to offload in surgical shoe for next four weeks to see if this calms things down   -Recommend protection, rest, ice, elevation daily until symptoms improve -Rx pain med/antinflammatories as needed -Patient to return to office in 4 weeks for serial x-rays to assess healing  or sooner if condition worsens.    Louann Sjogren, DPM

## 2023-05-02 DIAGNOSIS — H524 Presbyopia: Secondary | ICD-10-CM | POA: Diagnosis not present

## 2023-05-02 DIAGNOSIS — H52203 Unspecified astigmatism, bilateral: Secondary | ICD-10-CM | POA: Diagnosis not present

## 2023-05-02 DIAGNOSIS — H2513 Age-related nuclear cataract, bilateral: Secondary | ICD-10-CM | POA: Diagnosis not present

## 2023-05-02 DIAGNOSIS — H04123 Dry eye syndrome of bilateral lacrimal glands: Secondary | ICD-10-CM | POA: Diagnosis not present

## 2023-05-02 DIAGNOSIS — H25013 Cortical age-related cataract, bilateral: Secondary | ICD-10-CM | POA: Diagnosis not present

## 2023-05-02 DIAGNOSIS — H5213 Myopia, bilateral: Secondary | ICD-10-CM | POA: Diagnosis not present

## 2023-05-17 ENCOUNTER — Encounter: Payer: Self-pay | Admitting: Family Medicine

## 2023-05-17 DIAGNOSIS — Z13820 Encounter for screening for osteoporosis: Secondary | ICD-10-CM

## 2023-05-17 DIAGNOSIS — Z1231 Encounter for screening mammogram for malignant neoplasm of breast: Secondary | ICD-10-CM

## 2023-05-17 MED ORDER — PRAVASTATIN SODIUM 80 MG PO TABS: 80.0000 mg | ORAL_TABLET | Freq: Every day | ORAL | 1 refills | Status: DC

## 2023-05-17 NOTE — Telephone Encounter (Signed)
Okay to place orders 

## 2023-05-18 NOTE — Addendum Note (Signed)
Addended by: Roxanne Gates on: 05/18/2023 02:25 PM   Modules accepted: Orders

## 2023-05-28 ENCOUNTER — Ambulatory Visit: Payer: Medicare PPO | Admitting: Podiatry

## 2023-06-12 ENCOUNTER — Telehealth: Payer: Self-pay | Admitting: Family Medicine

## 2023-06-12 DIAGNOSIS — Z1231 Encounter for screening mammogram for malignant neoplasm of breast: Secondary | ICD-10-CM

## 2023-06-12 DIAGNOSIS — Z780 Asymptomatic menopausal state: Secondary | ICD-10-CM

## 2023-06-12 NOTE — Telephone Encounter (Signed)
Pt called & stated that she has been having issues scheduling her bone density scan & mammogram. She requested for the orders to be sent to [facility]. Please follow-up & advise pt.

## 2023-06-13 NOTE — Telephone Encounter (Addendum)
Orders changed to Breast Center. LMOM informing Pt that I have changed order locations, she should be able to call and schedule at breast center now.

## 2023-06-13 NOTE — Addendum Note (Signed)
Addended byConrad [place] D on: 06/13/2023 12:34 PM   Modules accepted: Orders

## 2023-06-26 ENCOUNTER — Other Ambulatory Visit (HOSPITAL_BASED_OUTPATIENT_CLINIC_OR_DEPARTMENT_OTHER): Payer: Self-pay

## 2023-06-26 DIAGNOSIS — L738 Other specified follicular disorders: Secondary | ICD-10-CM | POA: Diagnosis not present

## 2023-06-26 DIAGNOSIS — Z85828 Personal history of other malignant neoplasm of skin: Secondary | ICD-10-CM | POA: Diagnosis not present

## 2023-06-26 DIAGNOSIS — L820 Inflamed seborrheic keratosis: Secondary | ICD-10-CM | POA: Diagnosis not present

## 2023-06-26 DIAGNOSIS — L720 Epidermal cyst: Secondary | ICD-10-CM | POA: Diagnosis not present

## 2023-06-26 MED ORDER — COVID-19 MRNA VAC-TRIS(PFIZER) 30 MCG/0.3ML IM SUSY
0.3000 mL | PREFILLED_SYRINGE | Freq: Once | INTRAMUSCULAR | 0 refills | Status: AC
  Filled 2023-06-26: qty 0.3, 1d supply, fill #0

## 2023-06-26 MED ORDER — INFLUENZA VAC A&B SURF ANT ADJ 0.5 ML IM SUSY
0.5000 mL | PREFILLED_SYRINGE | Freq: Once | INTRAMUSCULAR | 0 refills | Status: AC
  Filled 2023-06-26: qty 0.5, 1d supply, fill #0

## 2023-09-19 ENCOUNTER — Ambulatory Visit: Payer: Medicare PPO | Admitting: Cardiology

## 2023-09-19 ENCOUNTER — Ambulatory Visit
Admission: RE | Admit: 2023-09-19 | Discharge: 2023-09-19 | Disposition: A | Discharge status: Home / Self Care | Payer: Medicare PPO | Source: Ambulatory Visit | Attending: Family Medicine | Admitting: Family Medicine

## 2023-09-19 DIAGNOSIS — Z1231 Encounter for screening mammogram for malignant neoplasm of breast: Secondary | ICD-10-CM | POA: Diagnosis not present

## 2023-11-20 ENCOUNTER — Ambulatory Visit: Payer: Medicare PPO | Admitting: Family Medicine

## 2023-11-20 ENCOUNTER — Encounter: Payer: Self-pay | Admitting: Family Medicine

## 2023-11-20 VITALS — BP 118/78 | HR 52 | Temp 97.7°F | Resp 16 | Ht 63.5 in | Wt 144.2 lb

## 2023-11-20 DIAGNOSIS — E785 Hyperlipidemia, unspecified: Secondary | ICD-10-CM

## 2023-11-20 DIAGNOSIS — J324 Chronic pansinusitis: Secondary | ICD-10-CM | POA: Diagnosis not present

## 2023-11-20 DIAGNOSIS — J302 Other seasonal allergic rhinitis: Secondary | ICD-10-CM

## 2023-11-20 DIAGNOSIS — Z0000 Encounter for general adult medical examination without abnormal findings: Secondary | ICD-10-CM

## 2023-11-20 DIAGNOSIS — M8580 Other specified disorders of bone density and structure, unspecified site: Secondary | ICD-10-CM | POA: Diagnosis not present

## 2023-11-20 LAB — CBC WITH DIFFERENTIAL/PLATELET
Basophils Absolute: 0 10*3/uL (ref 0.0–0.1)
Basophils Relative: 1 % (ref 0.0–3.0)
Eosinophils Absolute: 0.1 10*3/uL (ref 0.0–0.7)
Eosinophils Relative: 2 % (ref 0.0–5.0)
HCT: 43.4 % (ref 36.0–46.0)
Hemoglobin: 14.2 g/dL (ref 12.0–15.0)
Lymphocytes Relative: 30.5 % (ref 12.0–46.0)
Lymphs Abs: 1.1 10*3/uL (ref 0.7–4.0)
MCHC: 32.6 g/dL (ref 30.0–36.0)
MCV: 95.6 fL (ref 78.0–100.0)
Monocytes Absolute: 0.5 10*3/uL (ref 0.1–1.0)
Monocytes Relative: 12.3 % — ABNORMAL HIGH (ref 3.0–12.0)
Neutro Abs: 2 10*3/uL (ref 1.4–7.7)
Neutrophils Relative %: 54.2 % (ref 43.0–77.0)
Platelets: 252 10*3/uL (ref 150.0–400.0)
RBC: 4.54 Mil/uL (ref 3.87–5.11)
RDW: 13.6 % (ref 11.5–15.5)
WBC: 3.7 10*3/uL — ABNORMAL LOW (ref 4.0–10.5)

## 2023-11-20 LAB — LIPID PANEL
Cholesterol: 199 mg/dL (ref 0–200)
HDL: 95.7 mg/dL (ref >39.00)
LDL Cholesterol: 88 mg/dL (ref 0–99)
NonHDL: 103.6
Total CHOL/HDL Ratio: 2
Triglycerides: 78 mg/dL (ref 0.0–149.0)
VLDL: 15.6 mg/dL (ref 0.0–40.0)

## 2023-11-20 LAB — TSH: TSH: 5.3 u[IU]/mL (ref 0.35–5.50)

## 2023-11-20 LAB — COMPREHENSIVE METABOLIC PANEL
ALT: 16 U/L (ref 0–35)
AST: 19 U/L (ref 0–37)
Albumin: 4.5 g/dL (ref 3.5–5.2)
Alkaline Phosphatase: 40 U/L (ref 39–117)
BUN: 14 mg/dL (ref 6–23)
CO2: 28 meq/L (ref 19–32)
Calcium: 9.4 mg/dL (ref 8.4–10.5)
Chloride: 106 meq/L (ref 96–112)
Creatinine, Ser: 0.75 mg/dL (ref 0.40–1.20)
GFR: 82.37 mL/min (ref >60.00)
Glucose, Bld: 81 mg/dL (ref 70–99)
Potassium: 4.4 meq/L (ref 3.5–5.1)
Sodium: 142 meq/L (ref 135–145)
Total Bilirubin: 0.6 mg/dL (ref 0.2–1.2)
Total Protein: 6.8 g/dL (ref 6.0–8.3)

## 2023-11-20 MED ORDER — FLUTICASONE PROPIONATE 50 MCG/ACT NA SUSP: 2.0000 | Freq: Every day | NASAL | 6 refills | Status: AC

## 2023-11-20 MED ORDER — ALENDRONATE SODIUM 70 MG PO TABS: 70.0000 mg | ORAL_TABLET | ORAL | 3 refills | Status: AC

## 2023-11-20 MED ORDER — PRAVASTATIN SODIUM 80 MG PO TABS: 80.0000 mg | ORAL_TABLET | Freq: Every day | ORAL | 1 refills | Status: DC

## 2023-11-20 MED ORDER — CETIRIZINE HCL 10 MG PO TABS: 10.0000 mg | ORAL_TABLET | Freq: Every day | ORAL | Status: AC

## 2023-11-20 NOTE — Patient Instructions (Signed)
 Cholesterol Content in Foods Cholesterol is a waxy, fat-like substance that helps to carry fat in the blood. The body needs cholesterol in small amounts, but too much cholesterol can cause damage to the arteries and heart. What foods have cholesterol?  Cholesterol is found in animal-based foods, such as meat, seafood, and dairy. Generally, low-fat dairy and lean meats have less cholesterol than full-fat dairy and fatty meats. The milligrams of cholesterol per serving (mg per serving) of common cholesterol-containing foods are listed below. Meats and other proteins Egg -- one large whole egg has 186 mg. Veal shank -- 4 oz (113 g) has 141 mg. Lean ground [place] (93% lean) -- 4 oz (113 g) has 118 mg. Fat-trimmed lamb loin -- 4 oz (113 g) has 106 mg. Lean ground beef (90% lean) -- 4 oz (113 g) has 100 mg. Lobster -- 3.5 oz (99 g) has 90 mg. Pork loin chops -- 4 oz (113 g) has 86 mg. Canned salmon -- 3.5 oz (99 g) has 83 mg. Fat-trimmed beef top loin -- 4 oz (113 g) has 78 mg. Frankfurter -- 1 frank (3.5 oz or 99 g) has 77 mg. Crab -- 3.5 oz (99 g) has 71 mg. Roasted chicken without skin, white meat -- 4 oz (113 g) has 66 mg. Light bologna -- 2 oz (57 g) has 45 mg. Deli-cut [place] -- 2 oz (57 g) has 31 mg. Canned tuna -- 3.5 oz (99 g) has 31 mg. Tomasa Blase -- 1 oz (28 g) has 29 mg. Oysters and mussels (raw) -- 3.5 oz (99 g) has 25 mg. Mackerel -- 1 oz (28 g) has 22 mg. Trout -- 1 oz (28 g) has 20 mg. Pork sausage -- 1 link (1 oz or 28 g) has 17 mg. Salmon -- 1 oz (28 g) has 16 mg. Tilapia -- 1 oz (28 g) has 14 mg. Dairy Soft-serve ice cream --  cup (4 oz or 86 g) has 103 mg. Whole-milk yogurt -- 1 cup (8 oz or 245 g) has 29 mg. Cheddar cheese -- 1 oz (28 g) has 28 mg. American cheese -- 1 oz (28 g) has 28 mg. Whole milk -- 1 cup (8 oz or 250 mL) has 23 mg. 2% milk -- 1 cup (8 oz or 250 mL) has 18 mg. Cream cheese -- 1 tablespoon (Tbsp) (14.5 g) has 15 mg. Cottage cheese --  cup (4 oz or  113 g) has 14 mg. Low-fat (1%) milk -- 1 cup (8 oz or 250 mL) has 10 mg. Sour cream -- 1 Tbsp (12 g) has 8.5 mg. Low-fat yogurt -- 1 cup (8 oz or 245 g) has 8 mg. Nonfat Greek yogurt -- 1 cup (8 oz or 228 g) has 7 mg. Half-and-half cream -- 1 Tbsp (15 mL) has 5 mg. Fats and oils Cod liver oil -- 1 tablespoon (Tbsp) (13.6 g) has 82 mg. Butter -- 1 Tbsp (14 g) has 15 mg. Lard -- 1 Tbsp (12.8 g) has 14 mg. Bacon grease -- 1 Tbsp (12.9 g) has 14 mg. Mayonnaise -- 1 Tbsp (13.8 g) has 5-10 mg. Margarine -- 1 Tbsp (14 g) has 3-10 mg. The items listed above may not be a complete list of foods with cholesterol. Exact amounts of cholesterol in these foods may vary depending on specific ingredients and brands. Contact a dietitian for more information. What foods do not have cholesterol? Most plant-based foods do not have cholesterol unless you combine them with a food that has  cholesterol. Foods without cholesterol include: Grains and cereals. Vegetables. Fruits. Vegetable oils, such as olive, canola, and sunflower oil. Legumes, such as peas, beans, and lentils. Nuts and seeds. Egg whites. The items listed above may not be a complete list of foods that do not have cholesterol. Contact a dietitian for more information. Summary The body needs cholesterol in small amounts, but too much cholesterol can cause damage to the arteries and heart. Cholesterol is found in animal-based foods, such as meat, seafood, and dairy. Generally, low-fat dairy and lean meats have less cholesterol than full-fat dairy and fatty meats. This information is not intended to replace advice given to you by your health care provider. Make sure you discuss any questions you have with your health care provider. Document Revised: 02/11/2021 Document Reviewed: 02/11/2021 Elsevier Patient Education  2024 [employer].

## 2023-11-20 NOTE — Progress Notes (Signed)
 Established Patient Office Visit  Subjective   Patient ID: Kaitlyn Perez, female    DOB: 1956/02/21  Age: 68 y.o. MRN: [id number]  Chief Complaint  Patient presents with   Hyperlipidemia   Follow-up    HPI Discussed the use of AI scribe software for clinical note transcription with the patient, who gave verbal consent to proceed.  History of Present Illness   Kaitlyn Perez is a 68 year old female who presents for medication refills and a referral to an ENT due to chronic sinus issues.  She has chronic sinus issues characterized by persistent sinus drainage that has not improved significantly with medication. She previously used Astelin  for 30 to 60 days but discontinued it due to grogginess and difficulty waking up in the morning. She currently uses Zyrtec  daily and occasionally takes Benadryl. She also uses a saline spray, which she finds helpful, especially at night. Her symptoms include a nose that remains either runny or clogged, leading to excessive use of tissues. She has had longstanding nasal sinus problems since childhood, which have significantly impacted her life, including missing school and work due to severe congestion.  Her sinus issues have worsened since contracting COVID-19 in 2022, describing it as a trigger that has not resolved. She has a long history of sinus problems, having undergone sinus surgeries in the past, which were beneficial until recently.  Her current medications include Fosamax  and a cholesterol medication, for which she is seeking refills. She is planning a trip to [place] and has scheduled a bone density scan for January 10, 2024, after her return.  No new symptoms reported. Regular dental and eye check-ups are maintained.      Patient Active Problem List   Diagnosis Date Noted   Numbness and tingling of right leg 05/30/2021   Hyperlipidemia 05/30/2021   Preventative health care 01/03/2021   Pre-ulcerative corn or callous 01/03/2021    Osteopenia 01/03/2021   Numbness of foot 06/12/2014   BCC (basal cell carcinoma of skin) 11/14/2011   Cervical polyp 11/14/2011   Past Medical History:  Diagnosis Date   Allergy    Cancer (HCC) 2006, 2020   basal call carcinoma - left upper side of thorax, right face   Cataract    are beginning    Hyperlipidemia    Osteopenia    Stress fracture 2019   foot   Past Surgical History:  Procedure Laterality Date   BREAST BIOPSY Right    BREAST CYST ASPIRATION     COLONOSCOPY     MANDIBLE SURGERY Bilateral 08/2012   NASAL SINUS SURGERY  2001   POLYPECTOMY     SKIN CANCER EXCISION     TONSILLECTOMY AND ADENOIDECTOMY  1967   Social History   Tobacco Use   Smoking status: Never   Smokeless tobacco: Never  Vaping Use   Vaping status: Never Used  Substance Use Topics   Alcohol use: Not Currently   Drug use: No   Social History   Socioeconomic History   Marital status: Married    Spouse name: Danice Dippolito   Number of children: Not on file   Years of education: 18   Highest education level: Master's degree (e.g., MA, MS, MEng, MEd, MSW, MBA)  Occupational History   Occupation: pharmacist, hospital VP    Employer: [employer]  Tobacco Use   Smoking status: Never   Smokeless tobacco: Never  Vaping Use   Vaping status: Never Used  Substance and Sexual  Activity   Alcohol use: Not Currently   Drug use: No   Sexual activity: Not Currently    Partners: Male    Birth control/protection: Post-menopausal  Other Topics Concern   Not on file  Social History Narrative   Exercise--yes, at least 4 days a week   Social Drivers of Corporate Investment Banker Strain: Low Risk  (11/13/2023)   Overall Financial Resource Strain (CARDIA)    Difficulty of Paying Living Expenses: Not hard at all  Food Insecurity: No Food Insecurity (11/13/2023)   Hunger Vital Sign    Worried About Running Out of Food in the Last Year: Never true    Ran Out of Food in the Last Year: Never true   Transportation Needs: No Transportation Needs (11/13/2023)   PRAPARE - Administrator, Civil Service (Medical): No    Lack of Transportation (Non-Medical): No  Physical Activity: Insufficiently Active (11/13/2023)   Exercise Vital Sign    Days of Exercise per Week: 1 day    Minutes of Exercise per Session: 30 min  Stress: No Stress Concern Present (11/13/2023)   [employer] of Occupational Health - Occupational Stress Questionnaire    Feeling of Stress : Not at all  Social Connections: Moderately Integrated (11/13/2023)   Social Connection and Isolation Panel [NHANES]    Frequency of Communication with Friends and Family: More than three times a week    Frequency of Social Gatherings with Friends and Family: More than three times a week    Attends Religious Services: More than 4 times per year    Active Member of [employer] or Organizations: No    Attends Engineer, Structural: Not on file    Marital Status: Married  Catering Manager Violence: Not on file   Family Status  Relation Name Status   Mother  Alive   Father  Deceased at age 33       metastatic lung ca   Brother  Alive   Brother  Alive   Brother  Alive   MGM  Deceased   MGF  Deceased   PGM  Deceased at age 69   PGF  Deceased       MVA   Mat Aunt  Alive   Youth Worker  (Not Specified)   Nurse, Mental Health  (Not Specified)   Bruna Nyhan  (Not Specified)   Bruna Nyhan  (Not Specified)   Bruna Uncle  Deceased       stroke   Bruna Brigham  (Not Specified)   Bruna Brigham  (Not Specified)   Bruna Brigham  (Not Specified)   Bruna Brigham  (Not Specified)   Other  (Not Specified)   Neg Hx  (Not Specified)  No partnership data on file   Family History  Problem Relation Age of Onset   Arthritis Mother    Hyperlipidemia Mother    Hypotension Mother    Cancer Mother 62       basal cell carcinoma   Macular degeneration Mother    Stroke Mother    Thyroid  disease Mother    Immunodeficiency Mother    Heart block Mother     Hyperlipidemia Father    Hypertension Father    Diabetes Father    Stroke Father    Lung cancer Father    Colon polyps Father    Melanoma Father    Skin cancer Brother    Arthritis Brother    Heart disease Maternal Grandmother 38  Multiple heart attacks   Heart disease Maternal Grandfather 65       Aortic aneurysm   Breast cancer Maternal Aunt    Heart disease Maternal Aunt    Heart disease Maternal Uncle    Uterine cancer Paternal Aunt    Heart disease Paternal Aunt    Colon cancer Paternal Uncle    Lung cancer Paternal Uncle        x's 2 smokers   Melanoma Paternal Uncle    Stroke Paternal Uncle    Heart Problems Paternal Uncle    Breast cancer Other        maternal female cousin   Esophageal cancer Neg Hx    Rectal cancer Neg Hx    Stomach cancer Neg Hx    Allergies  Allergen Reactions   Neosporin [Neomycin-Polymyxin-Gramicidin] Rash    Local rash      Review of Systems  Constitutional:  Negative for fever and malaise/fatigue.  HENT:  Negative for congestion.   Eyes:  Negative for blurred vision.  Respiratory:  Negative for cough and shortness of breath.   Cardiovascular:  Negative for chest pain, palpitations and leg swelling.  Gastrointestinal:  Negative for vomiting.  Musculoskeletal:  Negative for back pain.  Skin:  Negative for rash.  Neurological:  Negative for loss of consciousness and headaches.      Objective:     BP 118/78 (BP Location: Right Arm, Patient Position: Sitting, Cuff Size: Normal)   Pulse (!) 52   Temp 97.7 F (36.5 C) (Oral)   Resp 16   Ht 5' 3.5 (1.613 m)   Wt 144 lb 3.2 oz (65.4 kg)   LMP 10/17/2003 (Approximate)   SpO2 99%   BMI 25.14 kg/m  BP Readings from Last 3 Encounters:  11/20/23 118/78  03/21/23 114/76  12/12/22 126/86   Wt Readings from Last 3 Encounters:  11/20/23 144 lb 3.2 oz (65.4 kg)  03/21/23 143 lb (64.9 kg)  12/12/22 143 lb 3.2 oz (65 kg)   SpO2 Readings from Last 3 Encounters:  11/20/23 99%   12/12/22 99%  04/12/22 96%      Physical Exam Vitals and nursing note reviewed.  Constitutional:      General: She is not in acute distress.    Appearance: Normal appearance. She is well-developed.  HENT:     Head: Normocephalic and atraumatic.  Eyes:     General: No scleral icterus.       Right eye: No discharge.        Left eye: No discharge.  Cardiovascular:     Rate and Rhythm: Normal rate and regular rhythm.     Heart sounds: No murmur heard. Pulmonary:     Effort: Pulmonary effort is normal. No respiratory distress.     Breath sounds: Normal breath sounds.  Musculoskeletal:        General: Normal range of motion.     Cervical back: Normal range of motion and neck supple.     Right lower leg: No edema.     Left lower leg: No edema.  Skin:    General: Skin is warm and dry.  Neurological:     Mental Status: She is alert and oriented to person, place, and time.  Psychiatric:        Mood and Affect: Mood normal.        Behavior: Behavior normal.        Thought Content: Thought content normal.        Judgment:  Judgment normal.      No results found for any visits on 11/20/23.  Last CBC Lab Results  Component Value Date   WBC 5.9 12/12/2022   HGB 14.2 12/12/2022   HCT 42.8 12/12/2022   MCV 95.8 12/12/2022   RDW 13.6 12/12/2022   PLT 268.0 12/12/2022   Last metabolic panel Lab Results  Component Value Date   GLUCOSE 69 (L) 12/12/2022   NA 142 12/12/2022   K 4.0 12/12/2022   CL 105 12/12/2022   CO2 28 12/12/2022   BUN 17 12/12/2022   CREATININE 0.70 12/12/2022   GFR 90.07 12/12/2022   CALCIUM  10.0 12/12/2022   PROT 6.8 12/12/2022   ALBUMIN 4.3 12/12/2022   BILITOT 0.6 12/12/2022   ALKPHOS 37 (L) 12/12/2022   AST 17 12/12/2022   ALT 15 12/12/2022   Last lipids Lab Results  Component Value Date   CHOL 228 (H) 12/12/2022   HDL 92.70 12/12/2022   LDLCALC 116 (H) 12/12/2022   LDLDIRECT 152.7 01/09/2013   TRIG 96.0 12/12/2022   CHOLHDL 2  12/12/2022   Last hemoglobin A1c No results found for: HGBA1C Last thyroid  functions Lab Results  Component Value Date   TSH 3.49 01/03/2021   Last vitamin D  Lab Results  Component Value Date   VD25OH 30.68 12/30/2019   Last vitamin B12 and Folate Lab Results  Component Value Date   VITAMINB12 1,326 (H) 01/09/2013      The 10-year ASCVD risk score (Arnett DK, et al., 2019) is: 5.3%    Assessment & Plan:   Problem List Items Addressed This Visit       Unprioritized   Preventative health care - Primary   Osteopenia   Relevant Medications   alendronate  (FOSAMAX ) 70 MG tablet   Other Relevant Orders   CBC with Differential/Platelet   Comprehensive metabolic panel   Lipid panel   TSH   Hyperlipidemia   Relevant Medications   pravastatin  (PRAVACHOL ) 80 MG tablet   Other Relevant Orders   CBC with Differential/Platelet   Comprehensive metabolic panel   Lipid panel   TSH   Other Visit Diagnoses       Seasonal allergies       Relevant Medications   cetirizine  (ZYRTEC ) 10 MG tablet   Other Relevant Orders   Ambulatory referral to ENT     Chronic pansinusitis       Relevant Medications   cetirizine  (ZYRTEC ) 10 MG tablet   fluticasone  (FLONASE ) 50 MCG/ACT nasal spray   Other Relevant Orders   Ambulatory referral to ENT     Assessment and Plan    Chronic Sinusitis Chronic sinusitis persists with nasal congestion, rhinorrhea, and sinus drainage since a COVID-19 infection in 2022. Astelin  was discontinued due to grogginess. Current use of Zyrtec  and saline spray provides partial relief. There is a history of sinus surgery. An ENT referral and alternative treatments, including Flonase , Rhinocort, and Nasacort, were discussed. Generic Flonase  is preferred due to cost. Plan: Refer to ENT for further evaluation, prescribe generic Flonase , and advise a trial of over-the-counter Rhinocort or Nasacort if Flonase  is not tolerated.  Hyperlipidemia A refill of  cholesterol medication is required. There are no changes in family history or other relevant factors. Plan: Refill cholesterol medication.  Osteoporosis A refill of Fosamax  is needed. A bone density scan is scheduled for January 10, 2024. The option to hold the prescription until after the scan if not needed immediately was discussed. Plan: Refill Fosamax  and advise notification  if medication runs out before the bone density scan.  General Health Maintenance Dental and eye exams are up-to-date. A bone density scan is scheduled for January 10, 2024. The RSV vaccine for adults over 60, especially those immunocompromised, was discussed. COVID and flu vaccines were received in September 2024. Considering a COVID booster every six months for those over 65 was advised. Travel precautions, including mask use, Clorox wipes, and bringing pillowcases to prevent flu and COVID-19, were discussed. Plan: Discuss RSV vaccine, advise mask use on planes and in crowded places, recommend bringing masks, Clorox wipes, and pillowcases for travel, and recommend a COVID booster every six months for those over 65.  Follow-up Order labs today and follow up after the bone density scan on January 10, 2024.        Return in about 6 months (around 05/19/2024) for annual exam, fasting.    Jo Cerone R Lowne Chase, DO

## 2023-11-21 ENCOUNTER — Encounter: Payer: Self-pay | Admitting: Nurse Practitioner

## 2023-12-20 NOTE — Progress Notes (Deleted)
 HPI: FU bradycardia.  Echocardiogram July 2023 showed normal LV function, grade 1 diastolic dysfunction, question communication between ascending aorta and pulmonary artery, mild left atrial enlargement, mild mitral regurgitation.  I did review the images and felt there was no communication.  Since last seen  Current Outpatient Medications  Medication Sig Dispense Refill   alendronate (FOSAMAX) 70 MG tablet Take 1 tablet (70 mg total) by mouth every 7 (seven) days. Take with a full glass of water on an empty stomach. 12 tablet 3   Calcium Carb-Cholecalciferol (CALCIUM 500 + D) 500-5 MG-MCG TABS 3 po qd     cetirizine (ZYRTEC) 10 MG tablet Take 1 tablet (10 mg total) by mouth daily.     Cholecalciferol (VITAMIN D3) 25 MCG (1000 UT) capsule Take 1 capsule (1,000 Units total) by mouth daily. 30 capsule 0   Estradiol 10 MCG TABS vaginal tablet Place 1 tablet (10 mcg total) vaginally 2 (two) times a week. 24 tablet 3   fluticasone (FLONASE) 50 MCG/ACT nasal spray Place 2 sprays into both nostrils daily. 16 g 6   metroNIDAZOLE (METROCREAM) 0.75 % cream Apply topically 2 (two) times daily.     pravastatin (PRAVACHOL) 80 MG tablet Take 1 tablet (80 mg total) by mouth daily. 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, right face   Cataract    are beginning    Hyperlipidemia    Osteopenia    Stress fracture 2019   foot    Past Surgical History:  Procedure Laterality Date   BREAST BIOPSY Right    BREAST CYST ASPIRATION     COLONOSCOPY     MANDIBLE SURGERY Bilateral 08/2012   NASAL SINUS SURGERY  2001   POLYPECTOMY     SKIN CANCER EXCISION     TONSILLECTOMY AND ADENOIDECTOMY  1967     Social History   Socioeconomic History   Marital status: Married    Spouse name: Prarthana Parlin   Number of children: Not on file   Years of education: 18   Highest education level: Master's degree (e.g., MA, MS, MEng, MEd, MSW, MBA)  Occupational History   Occupation: Museum/gallery curator: [employer]  Tobacco Use   Smoking status: Never   Smokeless tobacco: Never  Vaping Use   Vaping status: Never Used  Substance and Sexual Activity   Alcohol use: Not Currently   Drug use: No   Sexual activity: Not Currently    Partners: Male    Birth control/protection: Post-menopausal  Other Topics Concern   Not on file  Social History Narrative   Exercise--yes, at least 4 days a week   Social Drivers of Corporate investment banker Strain: Low Risk  (11/13/2023)   Overall Financial Resource Strain (CARDIA)    Difficulty of Paying Living Expenses: Not hard at all  Food Insecurity: No Food Insecurity (11/13/2023)   Hunger Vital Sign    Worried About Running Out of Food in the Last Year: Never true    Ran Out of Food in the Last Year: Never true  Transportation Needs: No Transportation Needs (11/13/2023)  PRAPARE - Administrator, Civil Service (Medical): No    Lack of Transportation (Non-Medical): No  Physical Activity: Insufficiently Active (11/13/2023)   Exercise Vital Sign    Days of Exercise per Week: 1 day    Minutes of Exercise per Session: 30 min  Stress: No Stress Concern Present (11/13/2023)   [employer] of Occupational Health - Occupational Stress Questionnaire    Feeling of Stress : Not at all  Social Connections: Moderately Integrated (11/13/2023)   Social Connection and Isolation Panel [NHANES]    Frequency of Communication with Friends and Family: More than three times a week    Frequency of Social Gatherings with Friends and Family: More than three times a week    Attends Religious Services: More than 4 times per year    Active Member of  [employer] or Organizations: No    Attends Engineer, structural: Not on file    Marital Status: Married  Catering manager Violence: Not on file    Family History  Problem Relation Age of Onset   Arthritis Mother    Hyperlipidemia Mother    Hypotension Mother    Cancer Mother 76       basal cell carcinoma   Macular degeneration Mother    Stroke Mother    Thyroid disease Mother    Immunodeficiency Mother    Heart block Mother    Hyperlipidemia Father    Hypertension Father    Diabetes Father    Stroke Father    Lung cancer Father    Colon polyps Father    Melanoma Father    Skin cancer Brother    Arthritis Brother    Heart disease Maternal Grandmother 26       Multiple heart attacks   Heart disease Maternal Grandfather 59       Aortic aneurysm   Breast cancer Maternal Aunt    Heart disease Maternal Aunt    Heart disease Maternal Uncle    Uterine cancer Paternal Aunt    Heart disease Paternal Aunt    Colon cancer Paternal Uncle    Lung cancer Paternal Uncle        x's 2 smokers   Melanoma Paternal Uncle    Stroke Paternal Uncle    Heart Problems Paternal Uncle    Breast cancer Other        maternal female cousin   Esophageal cancer Neg Hx    Rectal cancer Neg Hx    Stomach cancer Neg Hx     ROS: no fevers or chills, productive cough, hemoptysis, dysphasia, odynophagia, melena, hematochezia, dysuria, hematuria, rash, seizure activity, orthopnea, PND, pedal edema, claudication. Remaining systems are negative.  Physical Exam: Well-developed well-nourished in no acute distress.  Skin is warm and dry.  HEENT is normal.  Neck is supple.  Chest is clear to auscultation with normal expansion.  Cardiovascular exam is regular rate and rhythm.  Abdominal exam nontender or distended. No masses palpated. Extremities show no edema. neuro grossly intact  ECG- personally reviewed  A/P  1 bradycardia-patient with history of bradycardia but no syncope.  LV function is  normal.  Will continue to follow.  Note both of her parents previously required pacemakers.  2 hyperlipidemia-continue pravastatin.  Olga Millers, MD

## 2024-01-02 ENCOUNTER — Ambulatory Visit: Payer: Medicare PPO | Admitting: Cardiology

## 2024-01-10 ENCOUNTER — Ambulatory Visit
Admission: RE | Admit: 2024-01-10 | Discharge: 2024-01-10 | Disposition: A | Discharge status: Home / Self Care | Payer: Medicare PPO | Source: Ambulatory Visit | Attending: Family Medicine | Admitting: Family Medicine

## 2024-01-10 DIAGNOSIS — E2839 Other primary ovarian failure: Secondary | ICD-10-CM | POA: Diagnosis not present

## 2024-01-10 DIAGNOSIS — N958 Other specified menopausal and perimenopausal disorders: Secondary | ICD-10-CM | POA: Diagnosis not present

## 2024-01-16 ENCOUNTER — Encounter: Payer: Self-pay | Admitting: Family Medicine

## 2024-01-21 ENCOUNTER — Ambulatory Visit (INDEPENDENT_AMBULATORY_CARE_PROVIDER_SITE_OTHER): Payer: Medicare PPO | Admitting: Otolaryngology

## 2024-01-21 ENCOUNTER — Encounter (INDEPENDENT_AMBULATORY_CARE_PROVIDER_SITE_OTHER): Payer: Self-pay

## 2024-01-21 VITALS — BP 128/85 | HR 62 | Ht 63.5 in | Wt 144.0 lb

## 2024-01-21 DIAGNOSIS — J3489 Other specified disorders of nose and nasal sinuses: Secondary | ICD-10-CM

## 2024-01-21 DIAGNOSIS — R0981 Nasal congestion: Secondary | ICD-10-CM | POA: Diagnosis not present

## 2024-01-21 DIAGNOSIS — J328 Other chronic sinusitis: Secondary | ICD-10-CM

## 2024-01-21 DIAGNOSIS — R0982 Postnasal drip: Secondary | ICD-10-CM | POA: Diagnosis not present

## 2024-01-21 MED ORDER — FLONASE SENSIMIST 27.5 MCG/SPRAY NA SUSP: 2.0000 | Freq: Two times a day (BID) | NASAL | 12 refills | Status: DC

## 2024-01-21 MED ORDER — IPRATROPIUM BROMIDE 0.06 % NA SOLN: 2.0000 | Freq: Two times a day (BID) | NASAL | 12 refills | Status: AC | PRN

## 2024-01-21 NOTE — Progress Notes (Signed)
 Dear Dr. Crecencio Dodge, Here is my assessment for our mutual patient, Kaitlyn Perez. Thank you for allowing me the opportunity to care for your patient. Please do not hesitate to contact me should you have any other questions. Sincerely, Dr. Milon Aloe  Otolaryngology Clinic Note  HISTORY: Kaitlyn Perez is a 68 y.o. female with history of chronic sinusitis s/p FESS kindly referred by Dr. Crecencio Dodge for evaluation of post nasal drip.   Initial visit (01/2024): She reports that she has had sinus issues for "most of her life." In 2001, she reports that she has had a septoplasty and right sided sinus surgery for recurrent sinus infections and things significantly improved since then. She reports she did well for several years after, until she got COVID in 2022 and then since then has had post nasal drip (especially at night with cough), bilateral rhinorrhea without discolored drainage. Denies significant facial pressure or pain. Sense of smell is generally without issue. She denies sinus infections/feeling like she has a sinus infection. Has not gotten abx/steroids for this.  No GERD sx - denies belching, burning.   Improvement occurred with nothing.  Allergy testing has not been done.  No recent CT Head.   She does have fairly significant AR symptoms - itching eyes, nose, throat and some rhinorrhea and sneezing; can be anytime but worse in fall.   She is currently using zyrtec  and benadryl; she has tried astelin  but made her quite drowsy so stopped; she has not tried flonase . Has not tried atrovent . Does not do rinses.   GLP-1: no AP/AC: no  Tobacco: no  PMHx: HLD, Allergic Rhinitis  H&N: T&A, Sinus surgery/septoplasty (2001), Orthognathic surgery  RADIOGRAPHIC EVALUATION AND INDEPENDENT REVIEW OF OTHER RECORDS: Dr. Crecencio Dodge [facility]) referral notes 11/20/2023: chronic sinus issues, = drainage, not improving; prior used astelin , currently on zyrtec  and benadryl; saline spray helpful, noted nasal  congestion; Dx: Chronic sinusitis - Rx: zyrtec , saline spray, ref to ENT and flonase . 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, right face   Cataract    are beginning    Hyperlipidemia    Osteopenia    Stress fracture 2019   foot   Past Surgical History:  Procedure Laterality Date   BREAST BIOPSY Right    BREAST CYST ASPIRATION     COLONOSCOPY     MANDIBLE SURGERY Bilateral 08/2012   NASAL SINUS SURGERY  2001   POLYPECTOMY     SKIN CANCER EXCISION     TONSILLECTOMY AND ADENOIDECTOMY  1967   Family History  Problem Relation Age of Onset   Arthritis Mother    Hyperlipidemia Mother    Hypotension Mother    Cancer Mother 17       basal cell carcinoma   Macular degeneration Mother    Stroke Mother    Thyroid  disease Mother    Immunodeficiency Mother    Heart block Mother    Hyperlipidemia Father    Hypertension Father    Diabetes Father    Stroke Father    Lung cancer Father    Colon polyps Father    Melanoma Father    Skin cancer Brother    Arthritis Brother    Heart disease Maternal Grandmother 35       Multiple heart  attacks   Heart disease Maternal Grandfather 65       Aortic aneurysm   Breast cancer Maternal Aunt    Heart disease Maternal Aunt    Heart disease Maternal Uncle    Uterine cancer Paternal Aunt    Heart disease Paternal Aunt    Colon cancer Paternal Uncle    Lung cancer Paternal Uncle        x's 2 smokers   Melanoma Paternal Uncle    Stroke Paternal Uncle    Heart Problems Paternal Uncle    Breast cancer Other        maternal female cousin   Esophageal cancer Neg Hx    Rectal cancer Neg Hx    Stomach cancer Neg Hx    Social History   Tobacco Use   Smoking status: Never   Smokeless tobacco: Never  Substance Use Topics   Alcohol use: Not Currently   Allergies   Allergen Reactions   Neosporin [Neomycin-Polymyxin-Gramicidin] Rash    Local rash   Current Outpatient Medications  Medication Sig Dispense Refill   alendronate  (FOSAMAX ) 70 MG tablet Take 1 tablet (70 mg total) by mouth every 7 (seven) days. Take with a full glass of water on an empty stomach. 12 tablet 3   Calcium  Carb-Cholecalciferol  (CALCIUM  500 + D) 500-5 MG-MCG TABS 3 po qd     cetirizine  (ZYRTEC ) 10 MG tablet Take 1 tablet (10 mg total) by mouth daily.     Cholecalciferol  (VITAMIN D3) 25 MCG (1000 UT) capsule Take 1 capsule (1,000 Units total) by mouth daily. 30 capsule 0   Estradiol  10 MCG TABS vaginal tablet Place 1 tablet (10 mcg total) vaginally 2 (two) times a week. 24 tablet 3   fluticasone  (FLONASE  SENSIMIST) 27.5 MCG/SPRAY nasal spray Place 2 sprays into the nose in the morning and at bedtime. 10 g 12   fluticasone  (FLONASE ) 50 MCG/ACT nasal spray Place 2 sprays into both nostrils daily. 16 g 6   ipratropium (ATROVENT ) 0.06 % nasal spray Place 2 sprays into both nostrils 2 (two) times daily as needed. 15 mL 12   metroNIDAZOLE (METROCREAM) 0.75 % cream Apply topically 2 (two) times daily.     pravastatin  (PRAVACHOL ) 80 MG tablet Take 1 tablet (80 mg total) by mouth daily. 90 tablet 1   Current Facility-Administered Medications  Medication Dose Route Frequency Provider Last Rate Last Admin   0.9 %  sodium chloride  infusion  500 mL Intravenous Once Nandigam, Kavitha V, MD       BP 128/85 (BP Location: Left Arm, Patient Position: Sitting, Cuff Size: Normal)   Pulse 62   Ht 5' 3.5" (1.613 m)   Wt 144 lb (65.3 kg)   LMP 10/17/2003 (Approximate)   SpO2 95%   BMI 25.11 kg/m   PHYSICAL EXAM:  BP 128/85 (BP Location: Left Arm, Patient Position: Sitting, Cuff Size: Normal)   Pulse 62   Ht 5' 3.5" (1.613 m)   Wt 144 lb (65.3 kg)   LMP 10/17/2003 (Approximate)   SpO2 95%   BMI 25.11 kg/m    Salient findings:  CN II-XII intact Bilateral EAC clear and TM intact with well  pneumatized middle ear spaces Nose: Anterior rhinoscopy reveals septum relatively midline; on palpation, does appear that septal cartilage is missing - I.e. evidence of septoplasty; bilateral inferior turbinates mild hypertrophy.  Nasal endoscopy was indicated to better evaluate the nose and paranasal sinuses, given the patient's history and exam findings, and is detailed below. No lesions  of oral cavity/oropharynx No obviously palpable neck masses/lymphadenopathy/thyromegaly No respiratory distress or stridor   PROCEDURE: Diagnostic Nasal Endoscopy Pre-procedure diagnosis: Concern for chronic sinusitis; nasal congestion, rule out structural cause Post-procedure diagnosis: same Indication: See pre-procedure diagnosis and physical exam above Complications: None apparent EBL: 0 mL Anesthesia: Lidocaine 4% and topical decongestant was topically sprayed in each nasal cavity  Description of Procedure:  Patient was identified. Verbal consent was obtained. A rigid 30 degree endoscope was utilized to evaluate the sinonasal cavities, mucosa, sinus ostia and turbinates and septum.  Overall, signs of mucosal inflammation are not noted.  Also noted are evidence of prior sinus surgery and septoplasty and turbinate reduction - left middle turbinate appears scarred to septum, bilateral ethmoidectomy also performed it appearance; on right, maxillary antrostomy also appears to be done but ostium/antrostomy appears way superior, uncinate partially may be present.  No mucopurulence, polyps, or masses noted.   Right Middle meatus: see above Right SE Recess: clear Left MM: see above Left SE Recess: clear  Photodocumentation was obtained.  CPT CODE -- 31231 - Mod 25   ASSESSMENT:  68 y.o. F with:  1. Other chronic sinusitis   2. Nasal congestion   3. Nasal obstruction   4. Post-nasal drip    Has history of chronic sinusitis with prior FESS and septo/turbs but main concern today is congestion and post  nasal drip. Endo is overall reassuring and she is not requiring any abx/steroids or have significant sinus infection symptoms as she had prior. She has tried astelin  but did not find it helpful, and currently using PO antihistamine and saline spray with some benefit.   PLAN: We've discussed issues and options today.  We reviewed the nasal endoscopy images together.  The risks, benefits and alternatives were discussed and questions answered.  She has elected to proceed with maximal medical management. No purulence on endo or within visualized sinus cavities so do not think abx needed here.  1) Start daily nasal rinses 2) Start flonase  BID and atrovent  BID PRN (especially at night can consider) 3) Continue PO antihistamine and saline spray PRN - Follow-up in 8 weeks -- sooner as necessary.  See below regarding exact medications prescribed this encounter including dosages and route: Meds ordered this encounter  Medications   fluticasone  (FLONASE  SENSIMIST) 27.5 MCG/SPRAY nasal spray    Sig: Place 2 sprays into the nose in the morning and at bedtime.    Dispense:  10 g    Refill:  12   ipratropium (ATROVENT ) 0.06 % nasal spray    Sig: Place 2 sprays into both nostrils 2 (two) times daily as needed.    Dispense:  15 mL    Refill:  12     Thank you for allowing me the opportunity to care for your patient. Please do not hesitate to contact me should you have any other questions.  Sincerely, Milon Aloe, MD Otolaryngologist (ENT), [facility] Phone: [phone] Fax: [phone]  I have personally spent 48 minutes involved in face-to-face and non-face-to-face activities for this patient on the day of the visit.  Professional time spent excludes any procedures performed but includes the following activities, in addition to those noted in the documentation: preparing to see the patient (review of outside documentation and results), performing a medically appropriate  examination, counseling, ordering medications (flonase , atrovent ), documenting in the electronic health record   02/04/2024, 11:08 AM

## 2024-01-21 NOTE — Patient Instructions (Signed)
 Use flonase two sprays each nostril twice per day; right after the flonase, use the atrovent nasal spray (two sprays each nostril up to twice per day as needed) Lloyd Huger Med Nasal Saline Rinse - use daily - start nasal saline rinses with NeilMed Bottle available over the counter    Nasal Saline Irrigation instructions: If you choose to make your own salt water solution, You will need: Salt (kosher, canning, or pickling salt) Baking soda Nasal irrigation bottle (i.e. Lloyd Huger Med Sinus Rinse) Measuring spoon ( teaspoon) Distilled / boiled water   Mix solution Mix 1 teaspoon of salt, 1/2 teaspoon of baking soda and 1 cup of water into irrigation bottle ** May use saline packet instead of homemade recipe for this step if you prefer If medicine was prescribed to be mixed with solution, place this into bottle Examples 2 inches of 2% mupirocin ointment Budesonide solution Position your head: Lean over sink (about 45 degrees) Rotate head (about 45 degrees) so that one nostril is above the other Irrigate Insert tip of irrigation bottle into upper nostril so it forms a comfortable seal Irrigate while breathing through your mouth May remove the straw from the bottle in order to irrigate the entire solution (important if medicine was added) Exhale through nose when finished and blow nose as necessary  Repeat on opposite side with other 1/2 of solution (120 mL) or remake solution if all 240 mL was used on first side Wash irrigation bottle regularly, replace every 3 months

## 2024-03-25 ENCOUNTER — Ambulatory Visit (INDEPENDENT_AMBULATORY_CARE_PROVIDER_SITE_OTHER): Admitting: Otolaryngology

## 2024-03-25 ENCOUNTER — Encounter (INDEPENDENT_AMBULATORY_CARE_PROVIDER_SITE_OTHER): Payer: Self-pay | Admitting: Otolaryngology

## 2024-03-25 VITALS — BP 125/79 | HR 63 | Ht 63.5 in | Wt 144.0 lb

## 2024-03-25 DIAGNOSIS — R059 Cough, unspecified: Secondary | ICD-10-CM

## 2024-03-25 DIAGNOSIS — R0982 Postnasal drip: Secondary | ICD-10-CM | POA: Diagnosis not present

## 2024-03-25 DIAGNOSIS — R0981 Nasal congestion: Secondary | ICD-10-CM | POA: Diagnosis not present

## 2024-03-25 DIAGNOSIS — Z8709 Personal history of other diseases of the respiratory system: Secondary | ICD-10-CM

## 2024-03-25 MED ORDER — FAMOTIDINE 20 MG PO TABS: 20.0000 mg | ORAL_TABLET | Freq: Every day | ORAL | 0 refills | Status: AC

## 2024-03-25 NOTE — Patient Instructions (Signed)
 Famotidine - take 20mg  at bedtime Reflux gourmet --- buy on amazon -- use use after dinner  Andres Bangs Med Nasal Saline Rinse   - start nasal saline rinses with NeilMed Bottle available over the counter    Nasal Saline Irrigation instructions: If you choose to make your own salt water solution, You will need: Salt (kosher, canning, or pickling salt) Baking soda Nasal irrigation bottle (i.e. Andres Bangs Med Sinus Rinse) Measuring spoon ( teaspoon) Distilled / boiled water   Mix solution Mix 1 teaspoon of salt, 1/2 teaspoon of baking soda and 1 cup of water into irrigation bottle ** May use saline packet instead of homemade recipe for this step if you prefer If medicine was prescribed to be mixed with solution, place this into bottle Examples 2 inches of 2% mupirocin ointment Budesonide solution Position your head: Lean over sink (about 45 degrees) Rotate head (about 45 degrees) so that one nostril is above the other Irrigate Insert tip of irrigation bottle into upper nostril so it forms a comfortable seal Irrigate while breathing through your mouth May remove the straw from the bottle in order to irrigate the entire solution (important if medicine was added) Exhale through nose when finished and blow nose as necessary  Repeat on opposite side with other 1/2 of solution (120 mL) or remake solution if all 240 mL was used on first side Wash irrigation bottle regularly, replace every 3 months

## 2024-03-25 NOTE — Progress Notes (Signed)
 Dear Dr. Crecencio Dodge, Here is my assessment for our mutual patient, Kaitlyn Perez. Thank you for allowing me the opportunity to care for your patient. Please do not hesitate to contact me should you have any other questions. Sincerely, Dr. Milon Aloe  Otolaryngology Clinic Note  HISTORY: Kaitlyn Perez is a 68 y.o. female with history of chronic sinusitis s/p FESS kindly referred by Dr. Crecencio Dodge for evaluation of post nasal drip.   Initial visit (01/2024): She reports that she has had sinus issues for "most of her life." In 2001, she reports that she has had a septoplasty and right sided sinus surgery for recurrent sinus infections and things significantly improved since then. She reports she did well for several years after, until she got COVID in 2022 and then since then has had post nasal drip (especially at night with cough), bilateral rhinorrhea without discolored drainage. Denies significant facial pressure or pain. Sense of smell is generally without issue. She denies sinus infections/feeling like she has a sinus infection. Has not gotten abx/steroids for this.  No GERD sx - denies belching, burning.   Improvement occurred with nothing.  Allergy testing has not been done.  No recent CT Head.   She does have fairly significant AR symptoms - itching eyes, nose, throat and some rhinorrhea and sneezing; can be anytime but worse in fall.   She is currently using zyrtec  and benadryl; she has tried astelin  but made her quite drowsy so stopped; she has not tried flonase . Has not tried atrovent . Does not do rinses.   --------------------------------------------------------- 03/25/2024 No more runny nose. PND is still present, cough is better but still there. No facial pressure/pain, no smell issues. She is using flonase  and atrovent  andthose have helped.   GLP-1: no AP/AC: no  Tobacco: no  PMHx: HLD, Allergic Rhinitis  H&N: T&A, Sinus surgery/septoplasty (2001), Orthognathic  surgery  RADIOGRAPHIC EVALUATION AND INDEPENDENT REVIEW OF OTHER RECORDS: Dr. Crecencio Dodge [facility]) referral notes 11/20/2023: chronic sinus issues, = drainage, not improving; prior used astelin , currently on zyrtec  and benadryl; saline spray helpful, noted nasal congestion; Dx: Chronic sinusitis - Rx: zyrtec , saline spray, ref to ENT and flonase . 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, right face   Cataract    are beginning    Hyperlipidemia    Osteopenia    Stress fracture 2019   foot   Past Surgical History:  Procedure Laterality Date   BREAST BIOPSY Right    BREAST CYST ASPIRATION     COLONOSCOPY     MANDIBLE SURGERY Bilateral 08/2012   NASAL SINUS SURGERY  2001   POLYPECTOMY     SKIN CANCER EXCISION     TONSILLECTOMY AND ADENOIDECTOMY  1967   Family History  Problem Relation Age of Onset   Arthritis Mother    Hyperlipidemia Mother    Hypotension Mother    Cancer Mother 4       basal cell carcinoma   Macular degeneration Mother    Stroke Mother    Thyroid  disease Mother    Immunodeficiency Mother    Heart block Mother    Hyperlipidemia Father    Hypertension Father    Diabetes Father    Stroke Father    Lung cancer Father    Colon polyps  Father    Melanoma Father    Skin cancer Brother    Arthritis Brother    Heart disease Maternal Grandmother 80       Multiple heart attacks   Heart disease Maternal Grandfather 49       Aortic aneurysm   Breast cancer Maternal Aunt    Heart disease Maternal Aunt    Heart disease Maternal Uncle    Uterine cancer Paternal Aunt    Heart disease Paternal Aunt    Colon cancer Paternal Uncle    Lung cancer Paternal Uncle        x's 2 smokers   Melanoma Paternal Uncle    Stroke Paternal Uncle    Heart Problems Paternal Uncle    Breast cancer Other         maternal female cousin   Esophageal cancer Neg Hx    Rectal cancer Neg Hx    Stomach cancer Neg Hx    Social History   Tobacco Use   Smoking status: Never   Smokeless tobacco: Never  Substance Use Topics   Alcohol use: Not Currently   Allergies  Allergen Reactions   Neosporin [Neomycin-Polymyxin-Gramicidin] Rash    Local rash   Current Outpatient Medications  Medication Sig Dispense Refill   alendronate  (FOSAMAX ) 70 MG tablet Take 1 tablet (70 mg total) by mouth every 7 (seven) days. Take with a full glass of water on an empty stomach. 12 tablet 3   cetirizine  (ZYRTEC ) 10 MG tablet Take 1 tablet (10 mg total) by mouth daily.     Cholecalciferol  (VITAMIN D3) 25 MCG (1000 UT) capsule Take 1 capsule (1,000 Units total) by mouth daily. 30 capsule 0   Estradiol  10 MCG TABS vaginal tablet Place 1 tablet (10 mcg total) vaginally 2 (two) times a week. 24 tablet 3   fluticasone  (FLONASE ) 50 MCG/ACT nasal spray Place 2 sprays into both nostrils daily. 16 g 6   ipratropium (ATROVENT ) 0.06 % nasal spray Place 2 sprays into both nostrils 2 (two) times daily as needed. 15 mL 12   metroNIDAZOLE (METROCREAM) 0.75 % cream Apply topically 2 (two) times daily.     pravastatin  (PRAVACHOL ) 80 MG tablet Take 1 tablet (80 mg total) by mouth daily. 90 tablet 1   Calcium  Carb-Cholecalciferol  (CALCIUM  500 + D) 500-5 MG-MCG TABS 3 po qd (Patient not taking: Reported on 03/25/2024)     fluticasone  (FLONASE  SENSIMIST) 27.5 MCG/SPRAY nasal spray Place 2 sprays into the nose in the morning and at bedtime. 10 g 12   Current Facility-Administered Medications  Medication Dose Route Frequency Provider Last Rate Last Admin   0.9 %  sodium chloride  infusion  500 mL Intravenous Once Nandigam, Kavitha V, MD       BP 125/79 (BP Location: Left Arm, Patient Position: Sitting, Cuff Size: Normal)   Pulse 63   Ht 5' 3.5" (1.613 m)   Wt 144 lb (65.3 kg)   LMP 10/17/2003 (Approximate)   SpO2 97%   BMI 25.11 kg/m    PHYSICAL EXAM:  BP 125/79 (BP Location: Left Arm, Patient Position: Sitting, Cuff Size: Normal)   Pulse 63   Ht 5' 3.5" (1.613 m)   Wt 144 lb (65.3 kg)   LMP 10/17/2003 (Approximate)   SpO2 97%   BMI 25.11 kg/m    Salient findings:  CN II-XII intact Bilateral EAC clear and TM intact with well pneumatized middle ear spaces Nose: Anterior rhinoscopy reveals septum relatively midline; on palpation, does appear that septal  cartilage is missing - I.e. evidence of septoplasty; bilateral inferior turbinates mild hypertrophy.  Nasal endoscopy was indicated to better evaluate the nose and paranasal sinuses, given the patient's history and exam findings and persistent findings, and is detailed below. No lesions of oral cavity/oropharynx No obviously palpable neck masses/lymphadenopathy/thyromegaly No respiratory distress or stridor   PROCEDURE: Diagnostic Nasal Endoscopy Pre-procedure diagnosis: Post nasal drip, rhinorrhea Post-procedure diagnosis: same Indication: See pre-procedure diagnosis and physical exam above Complications: None apparent EBL: 0 mL Anesthesia: Lidocaine 4% and topical decongestant was topically sprayed in each nasal cavity  Description of Procedure:  Patient was identified. Verbal consent was obtained. A rigid 30 degree endoscope was utilized to evaluate the sinonasal cavities, mucosa, sinus ostia and turbinates and septum.  Overall, signs of mucosal inflammation are not noted.  Also noted are evidence of prior sinus surgery and septoplasty and turbinate reduction - left middle turbinate appears scarred to septum, bilateral ethmoidectomy also performed it appears; on right, maxillary antrostomy also appears to be done but ostium/antrostomy appears way superior, uncinate partially may be present.  No mucopurulence, polyps, or masses noted.    Right Middle meatus: see above Right SE Recess: clear Left MM: see above Left SE Recess: clear No significant PND and no  purulence  CPT CODE -- 31231 - Mod 25   ASSESSMENT:  68 y.o. F with:  1. Nasal congestion   2. Post-nasal drip   3. History of chronic sinusitis   4. Cough, unspecified type    Has history of chronic sinusitis with prior FESS and septo/turbs but main concern today is congestion and post nasal drip and rhinorrhea. Improved on flonase  and atrovent  but night time sx still bothersome Endo is again overall reassuring and she is not requiring any abx/steroids or have significant sinus infection symptoms as she had prior. She has tried astelin  but did not find it helpful, and currently using PO antihistamine and saline spray with some benefit.   As a result, we discussed given cough re: LPR/Silent reflux. She agrees a trial of famotidine and reflux gourmet and rinses could be helpful. Discussed clarifix as last option but she understands may not help.   PLAN: We've discussed issues and options today.  We reviewed the nasal endoscopy images together.  The risks, benefits and alternatives were discussed and questions answered.    1) Start daily nasal rinses 2) Continue flonase  BID and atrovent  BID PRN (especially at night can consider) 3) Continue PO antihistamine and saline spray PRN 4) Famotidine trial at bedtime and reflux gourmet at night - Follow-up in 4 months; can consider clarifix  See below regarding exact medications prescribed this encounter including dosages and route: No orders of the defined types were placed in this encounter.    Thank you for allowing me the opportunity to care for your patient. Please do not hesitate to contact me should you have any other questions.  Sincerely, Milon Aloe, MD Otolaryngologist (ENT), [facility] Phone: [phone] Fax: [phone]  MDM:  Level 4: 99214 Complexity/Problems addressed: mod - chronic issues Data complexity: low - Morbidity: mod  - Drug prescribed or managed: y     03/25/2024, 1:46 PM

## 2024-05-06 ENCOUNTER — Ambulatory Visit (INDEPENDENT_AMBULATORY_CARE_PROVIDER_SITE_OTHER)

## 2024-05-06 ENCOUNTER — Ambulatory Visit: Admitting: Podiatry

## 2024-05-06 VITALS — Ht 63.5 in | Wt 144.0 lb

## 2024-05-06 DIAGNOSIS — M21611 Bunion of right foot: Secondary | ICD-10-CM

## 2024-05-06 DIAGNOSIS — H18591 Other hereditary corneal dystrophies, right eye: Secondary | ICD-10-CM | POA: Diagnosis not present

## 2024-05-06 DIAGNOSIS — H2513 Age-related nuclear cataract, bilateral: Secondary | ICD-10-CM | POA: Diagnosis not present

## 2024-05-06 DIAGNOSIS — B351 Tinea unguium: Secondary | ICD-10-CM

## 2024-05-06 DIAGNOSIS — M21619 Bunion of unspecified foot: Secondary | ICD-10-CM

## 2024-05-06 DIAGNOSIS — H25013 Cortical age-related cataract, bilateral: Secondary | ICD-10-CM | POA: Diagnosis not present

## 2024-05-06 DIAGNOSIS — H25043 Posterior subcapsular polar age-related cataract, bilateral: Secondary | ICD-10-CM | POA: Diagnosis not present

## 2024-05-06 DIAGNOSIS — H43813 Vitreous degeneration, bilateral: Secondary | ICD-10-CM | POA: Diagnosis not present

## 2024-05-06 DIAGNOSIS — M2011 Hallux valgus (acquired), right foot: Secondary | ICD-10-CM | POA: Diagnosis not present

## 2024-05-06 DIAGNOSIS — H524 Presbyopia: Secondary | ICD-10-CM | POA: Diagnosis not present

## 2024-05-06 DIAGNOSIS — H5213 Myopia, bilateral: Secondary | ICD-10-CM | POA: Diagnosis not present

## 2024-05-06 DIAGNOSIS — H04123 Dry eye syndrome of bilateral lacrimal glands: Secondary | ICD-10-CM | POA: Diagnosis not present

## 2024-05-06 NOTE — Patient Instructions (Addendum)
  VISIT SUMMARY: Today, you were seen for numbness in your right big toe, which has been worsening over the past few weeks. You also discussed ongoing issues with a bunion on your right foot and a suspected fungal infection in your toenail.  YOUR PLAN: -BUNION WITH HALLUX VALGUS: A bunion with hallux valgus is a deformity where the big toe drifts outward and the first metatarsal bone drifts away, causing pain and numbness due to nerve compression. We recommend a Lapidus procedure to correct the deformity and alleviate symptoms. This surgery involves fusing the joint to prevent excessive motion and realign the bones. We discussed the surgical process, potential outcomes, and recovery time, including the need for non-weight bearing for about 2 to 3 weeks with a knee scooter and gradual weight bearing in a walking boot over several weeks.  He will not be in a regular shoe until about 2 months after surgery.  We also talked about scheduling the surgery considering your personal commitments and the need for family support.  -ONYCHOMYCOSIS (SUSPECTED): Onychomycosis is a fungal infection of the nail, which can cause the nail to become thick, discolored, and dystrophic. We suspect this condition in your right big toe and will obtain a nail sample for fungal culture. We will await the culture results before starting any oral antifungal treatment.  INSTRUCTIONS: Please schedule the Lapidus procedure at a time that works best for you and your family. Additionally, we will contact you with the results of the fungal culture once they are available.                      Contains text generated by Abridge.                                 Contains text generated by Abridge.

## 2024-05-06 NOTE — Progress Notes (Signed)
 Subjective:  Patient ID: Kaitlyn Perez, female    DOB: 12-10-1955,  MRN: [id number]  Chief Complaint  Patient presents with   Bunions    RM 1 Patient is here for possible right bunion and a numbing sensation of the right hallux. Numbing sensation presented for the last four weeks.    Discussed the use of AI scribe software for clinical note transcription with the patient, who gave verbal consent to proceed.  History of Present Illness Kaitlyn Perez is a 68 year old female who presents with numbness in the right big toe.  She has been experiencing numbness in her right big toe, which has become more pervasive and constant over the past couple of weeks. The numbness is less severe today, possibly due to being off her feet more the previous day. She has a history of occasional numbness in the toe, which was previously mentioned to her primary care provider but was not considered significant at the time.  She experiences pain associated with a bunion on her right foot and is concerned that it may be contributing to the numbness. She reports that the bunion is now affecting the next toe and that she experiences pain all the time. She describes difficulty finding comfortable shoes due to her foot's unique shape, with a wide forefoot and narrow rest of the foot. She typically wears comfortable shoes and avoids heels, opting for sandals with straps that do not aggravate the bunion. She has tried various interventions, including bunion shields and toe spacers, but found them uncomfortable and ineffective.  She has been dealing with a dystrophic nail on the medial right hallux since 2017, which she suspects may be fungal in nature. She treated it with carousel, which stopped the progression but did not resolve the issue. She recalls the onset of the nail issue following a trip to [place], where her husband also fell ill. No cultures have been taken to confirm a fungal infection.  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 She is the only non-working family member available for these duties. No additional pain beyond what is usual with her bunion. No skin conditions such as eczema or psoriasis.      Objective:    Physical Exam VASCULAR: DP and PT pulse palpable. Foot is warm and well-perfused. Capillary fill time is brisk. DERMATOLOGIC: Dystrophic white nail growth on the medial right hallux. Normal skin turgor, texture, and temperature. No open lesions, rashes, or ulcerations. NEUROLOGIC: Normal sensation to light touch and pressure. No paresthesias on examination. ORTHOPEDIC: Moderate to severe hallux valgus and bunion deformity with hyper-mobility of the first ray with dorsiflexion and abduction. Smooth pain-free range of motion of all examined joints. No ecchymosis or bruising. No pain to palpation.   No images are attached to the encounter.    Results RADIOLOGY Right foot radiograph: Significant hallux valgus deformity, deviation of the first ray in transverse and frontal planes, no arthritic changes in the first MTP joint (05/06/2024)  Procedure: Nail sample collection Description: A sample was taken from the dystrophic white nail growth on the medial right hallux.   Assessment:   1. Hallux valgus with bunions, right   2. Onychomycosis      Plan:  Patient was evaluated and treated and all questions answered.  Assessment and Plan Assessment & Plan Bunion with hallux valgus Chronic bunion with hallux valgus deformity on the right foot, causing numbness in the big toe due  to nerve compression. The bunion is severe, with the first metatarsal drifting away and the big toe drifting outward. The condition is exacerbated by hypermobility in the joint, leading to increased side-to-side motion. Conservative measures such as pads and shoe modifications have been insufficient. - Recommend Lapidus and Akin osteotomy  procedure to correct deformity and alleviate symptoms due to the severity of the deformity and hypermobility. The procedure involves first TMT joint fusion to prevent excessive motion and realign bones. - Discuss surgical process, including use of plates and screws, potential outcomes such as numbness, and recovery time.  Discussed risk benefits and potential complications include not limited to pain, swelling, infection, scar, numbness which may be temporary or permanent, chronic pain, stiffness, nerve pain or damage, wound healing problems, bone healing problems including delayed or non-union. - Educate on post-operative care and recovery timeline, including non-weight bearing for 2 to 3 weeks and gradual weight bearing over several weeks with progression to surgical shoe around 6 to 8 weeks in regular shoe gear around 2 months. - Discuss surgical scheduling considering personal commitments and travel plans.  She has a trip to [place] early next year and if she would like to complete before then I suggest we schedule her for late October otherwise can wait until after her trip - Consider family support for surgery timing.  Onychomycosis (suspected) Suspected onychomycosis of the right hallux, with dystrophic white nail growth. The condition has been present intermittently since 2017, possibly acquired during travel. No prior cultures have been taken to confirm fungal infection. Topical treatments have been used without significant improvement. - Obtain nail sample for fungal culture. - Await culture results before initiating oral antifungal treatment.     Surgical plan:  Procedure: - Right foot Lapidus and Akin  Location: - [facility]  Anesthesia plan: - Sedation with regional block  Postoperative pain plan: - Tylenol 1000 mg every 6 hours, ibuprofen 600 mg every 6 hours, gabapentin 300 mg every 8 hours x5 days, oxycodone 5 mg 1-2 tabs every 6 hours only as needed  DVT prophylaxis: - None  required  WB Restrictions / DME needs: - NWB in CAM boot with knee scooter postop   No follow-ups on file.

## 2024-05-06 NOTE — Addendum Note (Signed)
 Addended by: JASMINE VERNELL PARAS on: 05/06/2024 01:11 PM   Modules accepted: Orders

## 2024-05-12 ENCOUNTER — Other Ambulatory Visit: Payer: Self-pay | Admitting: Podiatry

## 2024-05-13 ENCOUNTER — Telehealth: Payer: Self-pay | Admitting: Podiatry

## 2024-05-13 NOTE — Telephone Encounter (Signed)
 Received surgical consent forms.  Left message for pt to call to get her surgery scheduled.

## 2024-05-14 ENCOUNTER — Telehealth: Payer: Self-pay | Admitting: Podiatry

## 2024-05-14 NOTE — Telephone Encounter (Signed)
 Pt left message returning my call.  I called back and she is tentatively scheduled for 8/29 and will call if needs to be changed. She is not on any blood thinners or GLP1 medications and pharmacy is correct in chart.

## 2024-05-16 NOTE — Progress Notes (Signed)
 This is just 2 of us  here today    HPI: FU bradycardia.  Echocardiogram July 2023 showed normal LV function, grade 1 diastolic dysfunction, suspect communication between ascending aorta and pulmonary artery, mild left atrial enlargement, mild mitral regurgitation.  I personally reviewed the images and felt question communication was likely pulmonic insufficiency.  Since last seen she has some dyspnea with activities but no orthopnea, PND, pedal edema, exertional chest pain or syncope.  Occasional dizziness with changing positions.  Current Outpatient Medications  Medication Sig Dispense Refill   alendronate  (FOSAMAX ) 70 MG tablet Take 1 tablet (70 mg total) by mouth every 7 (seven) days. Take with a full glass of water on an empty stomach. 12 tablet 3   Calcium  Carb-Cholecalciferol  (CALCIUM  500 + D) 500-5 MG-MCG TABS 3 po qd (Patient taking differently: 3 po qd)     cetirizine  (ZYRTEC ) 10 MG tablet Take 1 tablet (10 mg total) by mouth daily.     Estradiol  10 MCG TABS vaginal tablet Place 1 tablet (10 mcg total) vaginally 2 (two) times a week. 24 tablet 3   famotidine  (PEPCID ) 20 MG tablet Take 1 tablet (20 mg total) by mouth at bedtime for 28 days. 30 tablet 0   fluticasone  (FLONASE ) 50 MCG/ACT nasal spray Place 2 sprays into both nostrils daily. 16 g 6   ipratropium (ATROVENT ) 0.06 % nasal spray Place 2 sprays into both nostrils 2 (two) times daily as needed. 15 mL 12   pravastatin  (PRAVACHOL ) 80 MG tablet Take 1 tablet (80 mg total) by mouth daily. 90 tablet 1   terbinafine  (LAMISIL ) 250 MG tablet Take 1 tablet (250 mg total) by mouth daily. 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, right face    Cataract    are beginning    Hyperlipidemia    Osteopenia    Stress fracture 2019   foot    Past Surgical History:  Procedure Laterality Date   BREAST BIOPSY Right    BREAST CYST ASPIRATION     COLONOSCOPY     MANDIBLE SURGERY Bilateral 08/2012   NASAL SINUS SURGERY  2001   POLYPECTOMY     SKIN CANCER EXCISION     TONSILLECTOMY AND ADENOIDECTOMY  1967    Social History   Socioeconomic History   Marital status: Married    Spouse name: Emberli Ballester   Number of children: Not on file   Years of education: 18   Highest education level: Master's degree (e.g., MA, MS, MEng, MEd, MSW, MBA)  Occupational History   Occupation: Museum/gallery curator: [employer]  Tobacco Use   Smoking status: Never   Smokeless tobacco: Never  Vaping Use   Vaping status: Never Used  Substance and Sexual Activity   Alcohol use: Not Currently   Drug use: No   Sexual activity: Not Currently    Partners: Male    Birth control/protection: Post-menopausal  Other Topics Concern   Not on file  Social History Narrative   Exercise--yes, at least 4 days a week   Social Drivers of Corporate investment banker Strain: Low Risk  (  11/13/2023)   Overall Financial Resource Strain (CARDIA)    Difficulty of Paying Living Expenses: Not hard at all  Food Insecurity: No Food Insecurity (11/13/2023)   Hunger Vital Sign    Worried About Running Out of Food in the Last Year: Never true    Ran Out of Food in the Last Year: Never true  Transportation Needs: No Transportation Needs (11/13/2023)   PRAPARE - Administrator, Civil Service (Medical): No    Lack of Transportation (Non-Medical): No  Physical Activity: Insufficiently Active (11/13/2023)   Exercise Vital Sign    Days of Exercise per Week: 1 day    Minutes of Exercise per Session: 30 min  Stress: No Stress Concern Present (11/13/2023)   [employer] of Occupational Health - Occupational Stress Questionnaire    Feeling of Stress : Not  at all  Social Connections: Moderately Integrated (11/13/2023)   Social Connection and Isolation Panel    Frequency of Communication with Friends and Family: More than three times a week    Frequency of Social Gatherings with Friends and Family: More than three times a week    Attends Religious Services: More than 4 times per year    Active Member of [employer] or Organizations: No    Attends Engineer, structural: Not on file    Marital Status: Married  Catering manager Violence: Not on file    Family History  Problem Relation Age of Onset   Arthritis Mother    Hyperlipidemia Mother    Hypotension Mother    Cancer Mother 93       basal cell carcinoma   Macular degeneration Mother    Stroke Mother    Thyroid  disease Mother    Immunodeficiency Mother    Heart block Mother    Hyperlipidemia Father    Hypertension Father    Diabetes Father    Stroke Father    Lung cancer Father    Colon polyps Father    Melanoma Father    Skin cancer Brother    Arthritis Brother    Heart disease Maternal Grandmother 9       Multiple heart attacks   Heart disease Maternal Grandfather 23       Aortic aneurysm   Breast cancer Maternal Aunt    Heart disease Maternal Aunt    Heart disease Maternal Uncle    Uterine cancer Paternal Aunt    Heart disease Paternal Aunt    Colon cancer Paternal Uncle    Lung cancer Paternal Uncle        x's 2 smokers   Melanoma Paternal Uncle    Stroke Paternal Uncle    Heart Problems Paternal Uncle    Breast cancer Other        maternal female cousin   Esophageal cancer Neg Hx    Rectal cancer Neg Hx    Stomach cancer Neg Hx     ROS: no fevers or chills, productive cough, hemoptysis, dysphasia, odynophagia, melena, hematochezia, dysuria, hematuria, rash, seizure activity, orthopnea, PND, pedal edema, claudication. Remaining systems are negative.  Physical Exam: Well-developed well-nourished in no acute distress.  Skin is warm and dry.  HEENT is  normal.  Neck is supple.  Chest is clear to auscultation with normal expansion.  Cardiovascular exam is regular rate and rhythm.  Abdominal exam nontender or distended. No masses palpated. Extremities show no edema. neuro grossly intact  EKG Interpretation Date/Time:  Wednesday May 28 2024 09:24:08 EDT Ventricular Rate:  48 PR Interval:  184 QRS Duration:  86 QT Interval:  454 QTC Calculation: 405 R Axis:   -5  Text Interpretation: Sinus bradycardia with sinus arrhythmia Confirmed by Pietro Rogue ([id number]) on 05/28/2024 9:42:45 AM    A/P  1 bradycardia-patient remains bradycardic but is not having symptoms.  Will continue to follow.  No indication for pacemaker.  2 hyperlipidemia-continue pravastatin .  Rogue Pietro, MD

## 2024-05-19 ENCOUNTER — Encounter: Payer: Self-pay | Admitting: Podiatry

## 2024-05-19 MED ORDER — TERBINAFINE HCL 250 MG PO TABS: 250.0000 mg | ORAL_TABLET | Freq: Every day | ORAL | 0 refills | Status: AC

## 2024-05-27 ENCOUNTER — Encounter: Payer: Self-pay | Admitting: Family Medicine

## 2024-05-28 ENCOUNTER — Other Ambulatory Visit: Payer: Self-pay | Admitting: Family Medicine

## 2024-05-28 ENCOUNTER — Encounter: Payer: Self-pay | Admitting: Cardiology

## 2024-05-28 ENCOUNTER — Ambulatory Visit: Attending: Cardiology | Admitting: Cardiology

## 2024-05-28 VITALS — BP 144/92 | HR 48 | Ht 63.5 in | Wt 145.0 lb

## 2024-05-28 DIAGNOSIS — R001 Bradycardia, unspecified: Secondary | ICD-10-CM

## 2024-05-28 DIAGNOSIS — M21619 Bunion of unspecified foot: Secondary | ICD-10-CM

## 2024-05-28 DIAGNOSIS — E785 Hyperlipidemia, unspecified: Secondary | ICD-10-CM

## 2024-05-28 NOTE — Patient Instructions (Signed)
   Follow-Up: At [facility], you and your health needs are our priority.  As part of our continuing mission to provide you with exceptional heart care, our providers are all part of one team.  This team includes your primary Cardiologist (physician) and Advanced Practice Providers or APPs (Physician Assistants and Nurse Practitioners) who all work together to provide you with the care you need, when you need it.  Your next appointment:   12 month(s)  Provider:   Redell Shallow MD

## 2024-05-30 ENCOUNTER — Telehealth: Payer: Self-pay | Admitting: Podiatry

## 2024-05-30 ENCOUNTER — Telehealth: Payer: Self-pay | Admitting: Cardiology

## 2024-05-30 NOTE — Telephone Encounter (Signed)
 Patient requested a phone call to discuss 2 part billing and what this all means for her as a patient.  Patient expressed her concerns about how noone could explain to her why we do the split billing and how she felt about it. She feels that this is not consumer friendly that patient's do not know about this before visits and having to consent to this without future notice  I explained to her that the two part billing has been in effect for about 2 years at most of our [facility] locations.I let her know that this split billing is 1 part for Facility Fee and 1 part Professional fee. The facility fee covers- equipment, staff , the building, etc. The professional fee covers the providers services. I also let her know that we are a hospital outpatient department of [facility] and this is why we have the 2 part billing.  She asked why this does not occur when she sees her PCP or other providers and I let her know it is department/specialty specific and varies by location. She has also asked how she is to navigate this as a patient within/outside of [facility]. I let her know if she would like to know beforehand she would need to call and ask how other facilities billing is done and if they charge for facility fees. I apologized on [facility] behalf that we were not able to explain it well while she was in the clinic and that I hope I explained everything else.   She thanked me for my call back and the explanation and she would like call me back if she had any other concerns.

## 2024-05-30 NOTE — Telephone Encounter (Signed)
 DOS- 06/13/2024  AIKEN OSTEOTOMY WITH BLOCK RT- 28310 LAPIDUS PROCEDURE INCLUDING BUNIONECTOMY RT- 71702  [employer] EFFECTIVE DATE- 05/16/2021  DEDUCTIBLE- N/A OOP- $4000 REMAINING- $3820 COINSURANCE- 0%/$250 COPAY  PER [employer] WEBSITE, PRIOR AUTHS FOR CPT CODES 71702 AND [phone] ARE APPROVED FROM 06/13/2024-09/13/2024. AUTH# [id number]

## 2024-06-02 ENCOUNTER — Telehealth: Payer: Self-pay | Admitting: Podiatry

## 2024-06-02 NOTE — Telephone Encounter (Signed)
 Pt left message stating she needed to change her surgery date.   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 She will call probably in January to get an appt to resign the consent forms.  I notified surgery center and cxled appts

## 2024-06-10 NOTE — Progress Notes (Unsigned)
 68 y.o. G0P0000 Married Caucasian female here for a breast and pelvic exam.    The patient is also followed for ***.  PCP: Antonio Cyndee Jamee JONELLE, DO   Patient's last menstrual period was 10/17/2003 (approximate).           Sexually active: No.  The current method of family planning is post menopausal status.    Menopausal hormone therapy:  Estradiol   Exercising: {yes no:314532}  {types:19826} Smoker:  no  OB History     Gravida  0   Para  0   Term  0   Preterm  0   AB  0   Living  0      SAB  0   IAB  0   Ectopic  0   Multiple  0   Live Births  0           HEALTH MAINTENANCE: Last 2 paps: 03/15/22 neg HR HPV neg, 12/26/18 neg HPV neg History of abnormal Pap or positive HPV:  no Mammogram:  09/19/23 Breast Density Cat B, BIRADS Cat 1 neg  Colonoscopy:  06/19/18  Bone Density:  01/10/24  Result  normal    Immunization History  Administered Date(s) Administered   Fluad  Quad(high Dose 65+) 08/24/2021, 08/15/2022   Fluad  Trivalent(High Dose 65+) 06/26/2023   Influenza Whole 08/14/2011   Influenza,inj,Quad PF,6+ Mos 10/05/2015, 09/21/2016, 09/11/2017, 08/21/2018, 07/17/2019, 08/12/2020   PFIZER(Purple Top)SARS-COV-2 Vaccination 01/16/2020, 02/09/2020, 09/05/2020, 04/04/2021   PNEUMOCOCCAL CONJUGATE-20 08/24/2021   Pfizer Covid-19 Vaccine Bivalent Booster 73yrs & up 12/15/2021   Pfizer(Comirnaty )Fall Seasonal Vaccine 12 years and older 08/25/2022, 06/26/2023   Tdap 01/09/2013, 02/21/2023   Zoster Recombinant(Shingrix ) 08/21/2018, 10/23/2018   Zoster, Live 09/21/2016      reports that she has never smoked. She has never used smokeless tobacco. She reports that she does not currently use alcohol. She reports that she does not use drugs.  Past Medical History:  Diagnosis Date   Allergy    Cancer (HCC) 2006, 2020   basal call carcinoma - left upper side of thorax, right face   Cataract    are beginning    Hyperlipidemia    Osteopenia    Stress  fracture 2019   foot    Past Surgical History:  Procedure Laterality Date   BREAST BIOPSY Right    BREAST CYST ASPIRATION     COLONOSCOPY     MANDIBLE SURGERY Bilateral 08/2012   NASAL SINUS SURGERY  2001   POLYPECTOMY     SKIN CANCER EXCISION     TONSILLECTOMY AND ADENOIDECTOMY  1967    Current Outpatient Medications  Medication Sig Dispense Refill   alendronate  (FOSAMAX ) 70 MG tablet Take 1 tablet (70 mg total) by mouth every 7 (seven) days. Take with a full glass of water on an empty stomach. 12 tablet 3   Calcium  Carb-Cholecalciferol  (CALCIUM  500 + D) 500-5 MG-MCG TABS 3 po qd (Patient taking differently: 3 po qd)     cetirizine  (ZYRTEC ) 10 MG tablet Take 1 tablet (10 mg total) by mouth daily.     Estradiol  10 MCG TABS vaginal tablet Place 1 tablet (10 mcg total) vaginally 2 (two) times a week. 24 tablet 3   famotidine  (PEPCID ) 20 MG tablet Take 1 tablet (20 mg total) by mouth at bedtime for 28 days. 30 tablet 0   fluticasone  (FLONASE ) 50 MCG/ACT nasal spray Place 2 sprays into both nostrils daily. 16 g 6   ipratropium (ATROVENT ) 0.06 % nasal spray  Place 2 sprays into both nostrils 2 (two) times daily as needed. 15 mL 12   pravastatin  (PRAVACHOL ) 80 MG tablet Take 1 tablet (80 mg total) by mouth daily. 90 tablet 1   terbinafine  (LAMISIL ) 250 MG tablet Take 1 tablet (250 mg total) by mouth daily. 90 tablet 0   Current Facility-Administered Medications  Medication Dose Route Frequency Provider Last Rate Last Admin   0.9 %  sodium chloride  infusion  500 mL Intravenous Once Nandigam, Kavitha V, MD        ALLERGIES: Neosporin [neomycin-polymyxin-gramicidin]  Family History  Problem Relation Age of Onset   Arthritis Mother    Hyperlipidemia Mother    Hypotension Mother    Cancer Mother 14       basal cell carcinoma   Macular degeneration Mother    Stroke Mother    Thyroid  disease Mother    Immunodeficiency Mother    Heart block Mother    Hyperlipidemia Father     Hypertension Father    Diabetes Father    Stroke Father    Lung cancer Father    Colon polyps Father    Melanoma Father    Skin cancer Brother    Arthritis Brother    Heart disease Maternal Grandmother 47       Multiple heart attacks   Heart disease Maternal Grandfather 106       Aortic aneurysm   Breast cancer Maternal Aunt    Heart disease Maternal Aunt    Heart disease Maternal Uncle    Uterine cancer Paternal Aunt    Heart disease Paternal Aunt    Colon cancer Paternal Uncle    Lung cancer Paternal Uncle        x's 2 smokers   Melanoma Paternal Uncle    Stroke Paternal Uncle    Heart Problems Paternal Uncle    Breast cancer Other        maternal female cousin   Esophageal cancer Neg Hx    Rectal cancer Neg Hx    Stomach cancer Neg Hx     Review of Systems  PHYSICAL EXAM:  LMP 10/17/2003 (Approximate)     General appearance: alert, cooperative and appears stated age Head: normocephalic, without obvious abnormality, atraumatic Neck: no adenopathy, supple, symmetrical, trachea midline and thyroid  normal to inspection and palpation Lungs: clear to auscultation bilaterally Breasts: normal appearance, no masses or tenderness, No nipple retraction or dimpling, No nipple discharge or bleeding, No axillary adenopathy Heart: regular rate and rhythm Abdomen: soft, non-tender; no masses, no organomegaly Extremities: extremities normal, atraumatic, no cyanosis or edema Skin: skin color, texture, turgor normal. No rashes or lesions Lymph nodes: cervical, supraclavicular, and axillary nodes normal. Neurologic: grossly normal  Pelvic: External genitalia:  no lesions              No abnormal inguinal nodes palpated.              Urethra:  normal appearing urethra with no masses, tenderness or lesions              Bartholins and Skenes: normal                 Vagina: normal appearing vagina with normal color and discharge, no lesions              Cervix: no lesions              Pap  taken: {yes no:314532} Bimanual Exam:  Uterus:  normal size, contour,  position, consistency, mobility, non-tender              Adnexa: no mass, fullness, tenderness              Rectal exam: {yes no:314532}.  Confirms.              Anus:  normal sphincter tone, no lesions  Chaperone was present for exam:  {BSCHAPERONE:31226::Emily F, CMA}  ASSESSMENT: Encounter for breast and pelvic exam.   ***  PLAN: Mammogram screening discussed. Self breast awareness reviewed. Pap and HRV collected:  {yes no:314532} Guidelines for Calcium , Vitamin D , regular exercise program including cardiovascular and weight bearing exercise. Medication refills:  *** {LABS (Optional):23779} Follow up:  ***    Additional counseling given.  {yes [id number]. ***  total time was spent for this patient encounter, including preparation, face-to-face counseling with the patient, coordination of care, and documentation of the encounter in addition to doing the breast and pelvic exam.

## 2024-06-11 ENCOUNTER — Ambulatory Visit: Admitting: Family

## 2024-06-11 ENCOUNTER — Ambulatory Visit (INDEPENDENT_AMBULATORY_CARE_PROVIDER_SITE_OTHER): Admitting: Obstetrics and Gynecology

## 2024-06-11 ENCOUNTER — Encounter: Payer: Self-pay | Admitting: Obstetrics and Gynecology

## 2024-06-11 VITALS — BP 112/76 | HR 59 | Ht 64.25 in | Wt 144.0 lb

## 2024-06-11 DIAGNOSIS — Z01419 Encounter for gynecological examination (general) (routine) without abnormal findings: Secondary | ICD-10-CM

## 2024-06-11 DIAGNOSIS — N952 Postmenopausal atrophic vaginitis: Secondary | ICD-10-CM

## 2024-06-11 DIAGNOSIS — Z5181 Encounter for therapeutic drug level monitoring: Secondary | ICD-10-CM | POA: Diagnosis not present

## 2024-06-11 MED ORDER — ESTRADIOL 10 MCG VA TABS: 1.0000 | ORAL_TABLET | VAGINAL | 3 refills | Status: AC

## 2024-06-11 NOTE — Patient Instructions (Signed)

## 2024-06-13 ENCOUNTER — Encounter: Payer: Self-pay | Admitting: Family Medicine

## 2024-06-13 DIAGNOSIS — Z7184 Encounter for health counseling related to travel: Secondary | ICD-10-CM

## 2024-06-19 ENCOUNTER — Encounter: Admitting: Podiatry

## 2024-06-30 ENCOUNTER — Encounter: Payer: Self-pay | Admitting: Family Medicine

## 2024-06-30 ENCOUNTER — Ambulatory Visit: Admitting: Family Medicine

## 2024-06-30 VITALS — BP 120/70 | HR 47 | Temp 98.1°F | Resp 18 | Ht 64.25 in | Wt 144.8 lb

## 2024-06-30 DIAGNOSIS — Z23 Encounter for immunization: Secondary | ICD-10-CM

## 2024-06-30 DIAGNOSIS — E785 Hyperlipidemia, unspecified: Secondary | ICD-10-CM

## 2024-06-30 DIAGNOSIS — Z789 Other specified health status: Secondary | ICD-10-CM

## 2024-06-30 DIAGNOSIS — M8580 Other specified disorders of bone density and structure, unspecified site: Secondary | ICD-10-CM

## 2024-06-30 DIAGNOSIS — Z8619 Personal history of other infectious and parasitic diseases: Secondary | ICD-10-CM | POA: Diagnosis not present

## 2024-06-30 LAB — CBC WITH DIFFERENTIAL/PLATELET
Basophils Absolute: 0.1 K/uL (ref 0.0–0.1)
Basophils Relative: 1.7 % (ref 0.0–3.0)
Eosinophils Absolute: 0.1 K/uL (ref 0.0–0.7)
Eosinophils Relative: 2 % (ref 0.0–5.0)
HCT: 43.8 % (ref 36.0–46.0)
Hemoglobin: 14.5 g/dL (ref 12.0–15.0)
Lymphocytes Relative: 23.5 % (ref 12.0–46.0)
Lymphs Abs: 0.9 K/uL (ref 0.7–4.0)
MCHC: 33.1 g/dL (ref 30.0–36.0)
MCV: 94.9 fl (ref 78.0–100.0)
Monocytes Absolute: 0.5 K/uL (ref 0.1–1.0)
Monocytes Relative: 12.2 % — ABNORMAL HIGH (ref 3.0–12.0)
Neutro Abs: 2.4 K/uL (ref 1.4–7.7)
Neutrophils Relative %: 60.6 % (ref 43.0–77.0)
Platelets: 286 K/uL (ref 150.0–400.0)
RBC: 4.62 Mil/uL (ref 3.87–5.11)
RDW: 13.7 % (ref 11.5–15.5)
WBC: 4 K/uL (ref 4.0–10.5)

## 2024-06-30 LAB — LIPID PANEL
Cholesterol: 227 mg/dL — ABNORMAL HIGH (ref 0–200)
HDL: 108.2 mg/dL (ref >39.00)
LDL Cholesterol: 105 mg/dL — ABNORMAL HIGH (ref 0–99)
NonHDL: 119.25
Total CHOL/HDL Ratio: 2
Triglycerides: 73 mg/dL (ref 0.0–149.0)
VLDL: 14.6 mg/dL (ref 0.0–40.0)

## 2024-06-30 LAB — COMPREHENSIVE METABOLIC PANEL WITH GFR
ALT: 14 U/L (ref 0–35)
AST: 18 U/L (ref 0–37)
Albumin: 4.7 g/dL (ref 3.5–5.2)
Alkaline Phosphatase: 41 U/L (ref 39–117)
BUN: 18 mg/dL (ref 6–23)
CO2: 29 meq/L (ref 19–32)
Calcium: 10.6 mg/dL — ABNORMAL HIGH (ref 8.4–10.5)
Chloride: 104 meq/L (ref 96–112)
Creatinine, Ser: 0.76 mg/dL (ref 0.40–1.20)
GFR: 80.72 mL/min (ref >60.00)
Glucose, Bld: 77 mg/dL (ref 70–99)
Potassium: 4.6 meq/L (ref 3.5–5.1)
Sodium: 142 meq/L (ref 135–145)
Total Bilirubin: 0.5 mg/dL (ref 0.2–1.2)
Total Protein: 7.1 g/dL (ref 6.0–8.3)

## 2024-06-30 LAB — TSH: TSH: 3.8 u[IU]/mL (ref 0.35–5.50)

## 2024-06-30 MED ORDER — TYPHOID VACCINE PO CPDR: 1.0000 | DELAYED_RELEASE_CAPSULE | ORAL | 0 refills | Status: AC

## 2024-06-30 NOTE — Progress Notes (Signed)
 "  Subjective:    Patient ID: Kaitlyn Perez, female    DOB: Nov 06, 1955, 68 y.o.   MRN: [id number]  Chief Complaint  Patient presents with   Hyperlipidemia   Follow-up    HPI Patient is in today for f/u cholesterol.   Discussed the use of AI scribe software for clinical note transcription with the patient, who gave verbal consent to proceed.  History of Present Illness Kaitlyn Perez is a 68 year old female who presents with finger swelling and limited mobility.  She has experienced swelling in her finger for approximately six to eight weeks, located at a specific joint, which she indicates by pointing. She is unable to fully bend the finger. The swelling is slightly better today but has been worse in the past. There is no history of injury to the finger, and she denies significant pain unless the area is squeezed. No swelling in the ankles except when flying or consuming too much salt.  She has a history of degenerative changes in her spine, identified during a bone density scan, and was told this was arthritis in her back. She has experienced intermittent low back issues since pulling her back out years ago, with stiffness after sitting for long periods, which improves with movement.  She has arthritis in her knee. She also suspects arthritis in her hip, as she experiences occasional pain, especially after vigorous walking.  She is currently taking Fosamax  for osteopenia, which she has been on for several years. Her bone density has improved to normal levels since starting the medication.  She discusses her travel plans and the need for vaccinations, including hepatitis A and typhoid, in preparation for an upcoming trip to [place] and [place]. She has previously received vaccinations for hepatitis B, measles, mumps, and chickenpox.    Past Medical History:  Diagnosis Date   Allergy    Cancer (HCC) 2006, 2020   basal call carcinoma - left upper side of thorax, right face   Cataract     are beginning    Hyperlipidemia    Osteopenia    Stress fracture 2019   foot    Past Surgical History:  Procedure Laterality Date   BREAST BIOPSY Right    BREAST CYST ASPIRATION     COLONOSCOPY     MANDIBLE SURGERY Bilateral 08/2012   NASAL SINUS SURGERY  2001   POLYPECTOMY     SKIN CANCER EXCISION     TONSILLECTOMY AND ADENOIDECTOMY  1967    Family History  Problem Relation Age of Onset   Arthritis Mother    Hyperlipidemia Mother    Hypotension Mother    Cancer Mother 78       basal cell carcinoma   Macular degeneration Mother    Stroke Mother    Thyroid  disease Mother    Immunodeficiency Mother    Heart block Mother    Hyperlipidemia Father    Hypertension Father    Diabetes Father    Stroke Father    Lung cancer Father    Colon polyps Father    Melanoma Father    Skin cancer Brother    Arthritis Brother    Heart disease Maternal Grandmother 40       Multiple heart attacks   Heart disease Maternal Grandfather 65       Aortic aneurysm   Breast cancer Maternal Aunt    Heart disease Maternal Aunt    Heart disease Maternal Uncle    Uterine cancer Paternal Aunt  Heart disease Paternal Aunt    Colon cancer Paternal Uncle    Lung cancer Paternal Uncle        x's 2 smokers   Melanoma Paternal Uncle    Stroke Paternal Uncle    Heart Problems Paternal Uncle    Breast cancer Other        maternal female cousin   Esophageal cancer Neg Hx    Rectal cancer Neg Hx    Stomach cancer Neg Hx     Social History   Socioeconomic History   Marital status: Married    Spouse name: Deana Krock   Number of children: Not on file   Years of education: 18   Highest education level: Master's degree (e.g., MA, MS, MEng, MEd, MSW, MBA)  Occupational History   Occupation: Museum/gallery Curator: [employer]  Tobacco Use   Smoking status: Never   Smokeless tobacco: Never  Vaping Use   Vaping status: Never Used  Substance and Sexual Activity   Alcohol use: Not  Currently   Drug use: No   Sexual activity: Not Currently    Partners: Male    Birth control/protection: Post-menopausal    Comment: Less than 5 , after 16, no std, no DES, no hx breast cancer, no abnormal pap  Other Topics Concern   Not on file  Social History Narrative   Exercise--yes, at least 4 days a week   Social Drivers of Corporate Investment Banker Strain: Low Risk  (06/29/2024)   Overall Financial Resource Strain (CARDIA)    Difficulty of Paying Living Expenses: Not hard at all  Food Insecurity: No Food Insecurity (06/29/2024)   Hunger Vital Sign    Worried About Running Out of Food in the Last Year: Never true    Ran Out of Food in the Last Year: Never true  Transportation Needs: No Transportation Needs (06/29/2024)   PRAPARE - Administrator, Civil Service (Medical): No    Lack of Transportation (Non-Medical): No  Physical Activity: Insufficiently Active (06/29/2024)   Exercise Vital Sign    Days of Exercise per Week: 2 days    Minutes of Exercise per Session: 30 min  Stress: No Stress Concern Present (06/29/2024)   [employer] of Occupational Health - Occupational Stress Questionnaire    Feeling of Stress: Only a little  Social Connections: Moderately Integrated (06/29/2024)   Social Connection and Isolation Panel    Frequency of Communication with Friends and Family: More than three times a week    Frequency of Social Gatherings with Friends and Family: Twice a week    Attends Religious Services: More than 4 times per year    Active Member of [employer] or Organizations: No    Attends Engineer, Structural: Not on file    Marital Status: Married  Catering Manager Violence: Not on file    Outpatient Medications Prior to Visit  Medication Sig Dispense Refill   alendronate  (FOSAMAX ) 70 MG tablet Take 1 tablet (70 mg total) by mouth every 7 (seven) days. Take with a full glass of water on an empty stomach. 12 tablet 3   Calcium  Carb-Cholecalciferol   (CALCIUM  500 + D) 500-5 MG-MCG TABS 3 po qd     cetirizine  (ZYRTEC ) 10 MG tablet Take 1 tablet (10 mg total) by mouth daily.     Estradiol  10 MCG TABS vaginal tablet Place 1 tablet (10 mcg total) vaginally 2 (two) times a week. 24 tablet 3  famotidine  (PEPCID ) 20 MG tablet Take 1 tablet (20 mg total) by mouth at bedtime for 28 days. 30 tablet 0   fluticasone  (FLONASE ) 50 MCG/ACT nasal spray Place 2 sprays into both nostrils daily. 16 g 6   ipratropium (ATROVENT ) 0.06 % nasal spray Place 2 sprays into both nostrils 2 (two) times daily as needed. 15 mL 12   pravastatin  (PRAVACHOL ) 80 MG tablet Take 1 tablet (80 mg total) by mouth daily. 90 tablet 1   terbinafine  (LAMISIL ) 250 MG tablet Take 1 tablet (250 mg total) by mouth daily. 90 tablet 0   Facility-Administered Medications Prior to Visit  Medication Dose Route Frequency Provider Last Rate Last Admin   0.9 %  sodium chloride  infusion  500 mL Intravenous Once Nandigam, Kavitha V, MD        Allergies  Allergen Reactions   Neosporin [Neomycin-Polymyxin-Gramicidin] Rash    Local rash    Review of Systems  Constitutional:  Negative for fever and malaise/fatigue.  HENT:  Negative for congestion.   Eyes:  Negative for blurred vision.  Respiratory:  Negative for cough and shortness of breath.   Cardiovascular:  Negative for chest pain, palpitations and leg swelling.  Gastrointestinal:  Negative for vomiting.  Musculoskeletal:  Negative for back pain.  Skin:  Negative for rash.  Neurological:  Negative for loss of consciousness and headaches.       Objective:    Physical Exam Vitals and nursing note reviewed.  Constitutional:      General: She is not in acute distress.    Appearance: Normal appearance. She is well-developed.  HENT:     Head: Normocephalic and atraumatic.  Eyes:     General: No scleral icterus.       Right eye: No discharge.        Left eye: No discharge.  Cardiovascular:     Rate and Rhythm: Normal rate and  regular rhythm.     Heart sounds: No murmur heard. Pulmonary:     Effort: Pulmonary effort is normal. No respiratory distress.     Breath sounds: Normal breath sounds.  Musculoskeletal:        General: Normal range of motion.     Cervical back: Normal range of motion and neck supple.     Right lower leg: No edema.     Left lower leg: No edema.  Skin:    General: Skin is warm and dry.  Neurological:     Mental Status: She is alert and oriented to person, place, and time.  Psychiatric:        Mood and Affect: Mood normal.        Behavior: Behavior normal.        Thought Content: Thought content normal.        Judgment: Judgment normal.     BP 120/70 (BP Location: Left Arm, Patient Position: Sitting, Cuff Size: Normal)   Pulse (!) 47   Temp 98.1 F (36.7 C) (Oral)   Resp 18   Ht 5' 4.25 (1.632 m)   Wt 144 lb 12.8 oz (65.7 kg)   LMP 10/17/2003 (Approximate)   SpO2 98%   BMI 24.66 kg/m  Wt Readings from Last 3 Encounters:  06/30/24 144 lb 12.8 oz (65.7 kg)  06/11/24 144 lb (65.3 kg)  05/28/24 145 lb (65.8 kg)    Diabetic Foot Exam - Simple   No data filed    Lab Results  Component Value Date   WBC 4.0 06/30/2024  HGB 14.5 06/30/2024   HCT 43.8 06/30/2024   PLT 286.0 06/30/2024   GLUCOSE 77 06/30/2024   CHOL 227 (H) 06/30/2024   TRIG 73.0 06/30/2024   HDL 108.20 06/30/2024   LDLDIRECT 152.7 01/09/2013   LDLCALC 105 (H) 06/30/2024   ALT 14 06/30/2024   AST 18 06/30/2024   NA 142 06/30/2024   K 4.6 06/30/2024   CL 104 06/30/2024   CREATININE 0.76 06/30/2024   BUN 18 06/30/2024   CO2 29 06/30/2024   TSH 3.80 06/30/2024    Lab Results  Component Value Date   TSH 3.80 06/30/2024   Lab Results  Component Value Date   WBC 4.0 06/30/2024   HGB 14.5 06/30/2024   HCT 43.8 06/30/2024   MCV 94.9 06/30/2024   PLT 286.0 06/30/2024   Lab Results  Component Value Date   NA 142 06/30/2024   K 4.6 06/30/2024   CO2 29 06/30/2024   GLUCOSE 77 06/30/2024    BUN 18 06/30/2024   CREATININE 0.76 06/30/2024   BILITOT 0.5 06/30/2024   ALKPHOS 41 06/30/2024   AST 18 06/30/2024   ALT 14 06/30/2024   PROT 7.1 06/30/2024   ALBUMIN 4.7 06/30/2024   CALCIUM  10.6 (H) 06/30/2024   GFR 80.72 06/30/2024   Lab Results  Component Value Date   CHOL 227 (H) 06/30/2024   Lab Results  Component Value Date   HDL 108.20 06/30/2024   Lab Results  Component Value Date   LDLCALC 105 (H) 06/30/2024   Lab Results  Component Value Date   TRIG 73.0 06/30/2024   Lab Results  Component Value Date   CHOLHDL 2 06/30/2024   No results found for: HGBA1C     Assessment & Plan:  Osteopenia, unspecified location  Hyperlipidemia, unspecified hyperlipidemia type Assessment & Plan: Encourage heart healthy diet such as MIND or DASH diet, increase exercise, avoid trans fats, simple carbohydrates and processed foods, consider a krill or fish or flaxseed oil cap daily.    Orders: -     CBC with Differential/Platelet -     Comprehensive metabolic panel with GFR -     Lipid panel -     TSH -     CT CARDIAC SCORING (SELF PAY ONLY); Future -     Lipoprotein A (LPA)  Need for hepatitis A immunization -     Hepatitis A vaccine adult IM  History of hepatitis B virus infection conferring immunity -     Hepatitis B surface antibody,quantitative  Measles, mumps, rubella (MMR) vaccination status unknown -     Measles/Mumps/Rubella Immunity  Need for cholera and typhoid-paratyphoid (TAB) vaccination -     Typhoid Vaccine; Take 1 capsule by mouth every other day.  Dispense: 4 capsule; Refill: 0   Assessment and Plan Assessment & Plan Osteoarthritis of multiple sites   Chronic osteoarthritis affects her finger, lumbar spine, knee, and possibly hip. The finger is swollen and cannot bend fully, likely due to arthritis. Degenerative changes in the lumbar spine may cause stiffness after prolonged sitting. The knee has bone-on-bone arthritis from a past injury. Hip  pain occurs intermittently, possibly from previous vigorous walking. A family history of arthritis is noted, and a pseudo arthritic nodule was previously biopsied from the elbow. Continue Tylenol Arthritis or ibuprofen as needed for pain. Encourage regular exercise to maintain joint function and reduce stiffness. Consider referral to a hand specialist if finger symptoms worsen. No immediate imaging unless symptoms significantly worsen.  Osteopenia   Osteopenia,  previously managed with Fosamax , has improved bone density to normal levels. She is considering switching from Fosamax  due to travel-related inconvenience. Prolia is not an option due to insurance limitations. A medication holiday is appropriate after five years of Fosamax  use. Continue Fosamax  until completion, then discontinue. Plan to recheck bone density in one to two years, depending on insurance approval. Continue calcium  and vitamin D  supplementation.  Hyperlipidemia   No specific discussion of hyperlipidemia management in this encounter. She mentioned pravastatin  prescription expiration.  General Health Maintenance   She is preparing for travel to [place] and [place], requiring specific vaccinations. She has received hepatitis B vaccination and is up to date on tetanus. Considering RSV vaccination based on age recommendations. Administer hepatitis A vaccine today. Plan to receive flu and COVID vaccines soon. Order typhoid vaccine to be taken as a pill before travel. Check hepatitis B antibody levels to confirm immunity. Order calcium  score CT scan to assess cardiovascular risk. Discuss RSV vaccination with pharmacy if indicated.   Rox Mcgriff R Lowne Chase, DO "

## 2024-07-01 ENCOUNTER — Other Ambulatory Visit: Payer: Self-pay | Admitting: Family Medicine

## 2024-07-01 DIAGNOSIS — Z1231 Encounter for screening mammogram for malignant neoplasm of breast: Secondary | ICD-10-CM

## 2024-07-02 ENCOUNTER — Ambulatory Visit: Payer: Self-pay | Admitting: Family Medicine

## 2024-07-02 ENCOUNTER — Ambulatory Visit (HOSPITAL_BASED_OUTPATIENT_CLINIC_OR_DEPARTMENT_OTHER)
Admission: RE | Admit: 2024-07-02 | Discharge: 2024-07-02 | Disposition: A | Discharge status: Home / Self Care | Payer: Self-pay | Source: Ambulatory Visit | Attending: Family Medicine | Admitting: Family Medicine

## 2024-07-02 DIAGNOSIS — E785 Hyperlipidemia, unspecified: Secondary | ICD-10-CM | POA: Insufficient documentation

## 2024-07-02 NOTE — Assessment & Plan Note (Signed)
 Encourage heart healthy diet such as MIND or DASH diet, increase exercise, avoid trans fats, simple carbohydrates and processed foods, consider a krill or fish or flaxseed oil cap daily.

## 2024-07-03 ENCOUNTER — Encounter: Admitting: Podiatry

## 2024-07-03 ENCOUNTER — Other Ambulatory Visit: Payer: Self-pay | Admitting: Family Medicine

## 2024-07-03 DIAGNOSIS — E785 Hyperlipidemia, unspecified: Secondary | ICD-10-CM

## 2024-07-03 LAB — MEASLES/MUMPS/RUBELLA IMMUNITY
Mumps IgG: 287 [AU]/ml
Rubella: 2.85 {index}
Rubeola IgG: >300 [AU]/ml

## 2024-07-03 LAB — LIPOPROTEIN A (LPA): Lipoprotein (a): 27 nmol/L (ref <75)

## 2024-07-03 LAB — HEPATITIS B SURFACE ANTIBODY, QUANTITATIVE: Hep B S AB Quant (Post): 38 m[IU]/mL (ref >=10)

## 2024-07-07 ENCOUNTER — Other Ambulatory Visit: Payer: Self-pay

## 2024-07-07 ENCOUNTER — Other Ambulatory Visit: Payer: Self-pay | Admitting: *Deleted

## 2024-07-07 DIAGNOSIS — E785 Hyperlipidemia, unspecified: Secondary | ICD-10-CM

## 2024-07-07 MED ORDER — PRAVASTATIN SODIUM 80 MG PO TABS
80.0000 mg | ORAL_TABLET | Freq: Every day | ORAL | 1 refills | Status: AC
Start: 2024-07-07 — End: ?

## 2024-07-18 ENCOUNTER — Ambulatory Visit (INDEPENDENT_AMBULATORY_CARE_PROVIDER_SITE_OTHER): Admitting: Otolaryngology

## 2024-07-23 ENCOUNTER — Ambulatory Visit

## 2024-07-24 ENCOUNTER — Other Ambulatory Visit (HOSPITAL_BASED_OUTPATIENT_CLINIC_OR_DEPARTMENT_OTHER): Payer: Self-pay

## 2024-07-24 ENCOUNTER — Encounter: Admitting: Podiatry

## 2024-07-24 MED ORDER — FLUZONE HIGH-DOSE 0.5 ML IM SUSY
0.5000 mL | PREFILLED_SYRINGE | Freq: Once | INTRAMUSCULAR | 0 refills | Status: AC
  Filled 2024-07-24: qty 0.5, 1d supply, fill #0

## 2024-07-24 MED ORDER — COMIRNATY 30 MCG/0.3ML IM SUSY
0.3000 mL | PREFILLED_SYRINGE | Freq: Once | INTRAMUSCULAR | 0 refills | Status: AC
  Filled 2024-07-24: qty 0.3, 1d supply, fill #0

## 2024-07-29 ENCOUNTER — Encounter (INDEPENDENT_AMBULATORY_CARE_PROVIDER_SITE_OTHER): Payer: Self-pay | Admitting: Otolaryngology

## 2024-07-29 ENCOUNTER — Ambulatory Visit (INDEPENDENT_AMBULATORY_CARE_PROVIDER_SITE_OTHER): Admitting: Otolaryngology

## 2024-07-29 VITALS — BP 125/62 | HR 45 | Ht 64.0 in | Wt 144.0 lb

## 2024-07-29 DIAGNOSIS — Z8709 Personal history of other diseases of the respiratory system: Secondary | ICD-10-CM

## 2024-07-29 DIAGNOSIS — R0982 Postnasal drip: Secondary | ICD-10-CM | POA: Diagnosis not present

## 2024-07-29 DIAGNOSIS — J3489 Other specified disorders of nose and nasal sinuses: Secondary | ICD-10-CM

## 2024-07-29 DIAGNOSIS — R0981 Nasal congestion: Secondary | ICD-10-CM

## 2024-07-29 NOTE — Progress Notes (Signed)
 Dear Dr. Antonio Meth, Here is my assessment for our mutual patient, Kaitlyn Perez. Thank you for allowing me the opportunity to care for your patient. Please do not hesitate to contact me should you have any other questions. Sincerely, Dr. Eldora Blanch  Otolaryngology Clinic Note  HISTORY: Kaitlyn Perez is a 68 y.o. female with history of chronic sinusitis s/p FESS kindly referred by Dr. Antonio Meth for evaluation of post nasal drip.   Initial visit (01/2024): She reports that she has had sinus issues for most of her life. In 2001, she reports that she has had a septoplasty and right sided sinus surgery for recurrent sinus infections and things significantly improved since then. She reports she did well for several years after, until she got COVID in 2022 and then since then has had post nasal drip (especially at night with cough), bilateral rhinorrhea without discolored drainage. Denies significant facial pressure or pain. Sense of smell is generally without issue. She denies sinus infections/feeling like she has a sinus infection. Has not gotten abx/steroids for this.  No GERD sx - denies belching, burning.   Improvement occurred with nothing.  Allergy testing has not been done.  No recent CT Head.   She does have fairly significant AR symptoms - itching eyes, nose, throat and some rhinorrhea and sneezing; can be anytime but worse in fall.   She is currently using zyrtec  and benadryl; she has tried astelin  but made her quite drowsy so stopped; she has not tried flonase . Has not tried atrovent . Does not do rinses.   --------------------------------------------------------- 03/25/2024 No more runny nose. PND is still present, cough is better but still there. No facial pressure/pain, no smell issues. She is using flonase  and atrovent  andthose have helped.   --------------------------------------------------------- 07/29/2024 Symptoms are better now, but not all the way. No dripping, essentially  just some PND at night. Using pepcid , flonase  and atrovent  and tolerating those fairly well.    GLP-1: no AP/AC: no  Tobacco: no  PMHx: HLD, Allergic Rhinitis  H&N: T&A, Sinus surgery/septoplasty (2001), Orthognathic surgery  RADIOGRAPHIC EVALUATION AND INDEPENDENT REVIEW OF OTHER RECORDS: Dr. Antonio Meth [facility]) referral notes 11/20/2023: chronic sinus issues, = drainage, not improving; prior used astelin , currently on zyrtec  and benadryl; saline spray helpful, noted nasal congestion; Dx: Chronic sinusitis - Rx: zyrtec , saline spray, ref to ENT and flonase . 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, right face   Cataract    are beginning    Hyperlipidemia    Osteopenia    Stress fracture 2019   foot   Past Surgical History:  Procedure Laterality Date   BREAST BIOPSY Right    BREAST CYST ASPIRATION     COLONOSCOPY     MANDIBLE SURGERY Bilateral 08/2012   NASAL SINUS SURGERY  2001   POLYPECTOMY     SKIN CANCER EXCISION     TONSILLECTOMY AND ADENOIDECTOMY  1967   Family History  Problem Relation Age of Onset   Arthritis Mother    Hyperlipidemia Mother    Hypotension Mother    Cancer Mother 56       basal cell carcinoma   Macular degeneration Mother    Stroke Mother    Thyroid  disease Mother    Immunodeficiency Mother    Heart block  Mother    Hyperlipidemia Father    Hypertension Father    Diabetes Father    Stroke Father    Lung cancer Father    Colon polyps Father    Melanoma Father    Skin cancer Brother    Arthritis Brother    Heart disease Maternal Grandmother 46       Multiple heart attacks   Heart disease Maternal Grandfather 18       Aortic aneurysm   Breast cancer Maternal Aunt    Heart disease Maternal Aunt    Heart disease Maternal Uncle    Uterine cancer Paternal Aunt     Heart disease Paternal Aunt    Colon cancer Paternal Uncle    Lung cancer Paternal Uncle        x's 2 smokers   Melanoma Paternal Uncle    Stroke Paternal Uncle    Heart Problems Paternal Uncle    Breast cancer Other        maternal female cousin   Esophageal cancer Neg Hx    Rectal cancer Neg Hx    Stomach cancer Neg Hx    Social History   Tobacco Use   Smoking status: Never   Smokeless tobacco: Never  Substance Use Topics   Alcohol use: Not Currently   Allergies  Allergen Reactions   Neosporin [Neomycin-Polymyxin-Gramicidin] Rash    Local rash   Current Outpatient Medications  Medication Sig Dispense Refill   alendronate  (FOSAMAX ) 70 MG tablet Take 1 tablet (70 mg total) by mouth every 7 (seven) days. Take with a full glass of water on an empty stomach. 12 tablet 3   Calcium  Carb-Cholecalciferol  (CALCIUM  500 + D) 500-5 MG-MCG TABS 3 po qd     cetirizine  (ZYRTEC ) 10 MG tablet Take 1 tablet (10 mg total) by mouth daily.     Estradiol  10 MCG TABS vaginal tablet Place 1 tablet (10 mcg total) vaginally 2 (two) times a week. 24 tablet 3   famotidine  (PEPCID ) 20 MG tablet Take 1 tablet (20 mg total) by mouth at bedtime for 28 days. 30 tablet 0   fluticasone  (FLONASE ) 50 MCG/ACT nasal spray Place 2 sprays into both nostrils daily. 16 g 6   ipratropium (ATROVENT ) 0.06 % nasal spray Place 2 sprays into both nostrils 2 (two) times daily as needed. 15 mL 12   pravastatin  (PRAVACHOL ) 80 MG tablet Take 1 tablet (80 mg total) by mouth daily. 90 tablet 1   terbinafine  (LAMISIL ) 250 MG tablet Take 1 tablet (250 mg total) by mouth daily. 90 tablet 0   typhoid (VIVOTIF) DR capsule Take 1 capsule by mouth every other day. 4 capsule 0   Current Facility-Administered Medications  Medication Dose Route Frequency Provider Last Rate Last Admin   0.9 %  sodium chloride  infusion  500 mL Intravenous Once Nandigam, Kavitha V, MD       BP 125/62 (BP Location: Left Arm, Patient Position: Sitting, Cuff  Size: Normal)   Pulse (!) 45   Ht 5' 4 (1.626 m)   Wt 144 lb (65.3 kg)   LMP 10/17/2003 (Approximate)   SpO2 95%   BMI 24.72 kg/m   PHYSICAL EXAM:  BP 125/62 (BP Location: Left Arm, Patient Position: Sitting, Cuff Size: Normal)   Pulse (!) 45   Ht 5' 4 (1.626 m)   Wt 144 lb (65.3 kg)   LMP 10/17/2003 (Approximate)   SpO2 95%   BMI 24.72 kg/m    Salient findings:  CN II-XII  intact Bilateral EAC clear and TM intact with well pneumatized middle ear spaces Nose: Anterior rhinoscopy reveals septum relatively midline; on palpation, does appear that septal cartilage is missing - I.e. evidence of septoplasty; bilateral inferior turbinates mild hypertrophy.   No respiratory distress or stridor   PROCEDURE (Prior, not today): Diagnostic Nasal Endoscopy Pre-procedure diagnosis: Post nasal drip, rhinorrhea Post-procedure diagnosis: same Indication: See pre-procedure diagnosis and physical exam above Complications: None apparent EBL: 0 mL Anesthesia: Lidocaine 4% and topical decongestant was topically sprayed in each nasal cavity  Description of Procedure:  Patient was identified. Verbal consent was obtained. A rigid 30 degree endoscope was utilized to evaluate the sinonasal cavities, mucosa, sinus ostia and turbinates and septum.  Overall, signs of mucosal inflammation are not noted.  Also noted are evidence of prior sinus surgery and septoplasty and turbinate reduction - left middle turbinate appears scarred to septum, bilateral ethmoidectomy also performed it appears; on right, maxillary antrostomy also appears to be done but ostium/antrostomy appears way superior, uncinate partially may be present.  No mucopurulence, polyps, or masses noted.    Right Middle meatus: see above Right SE Recess: clear Left MM: see above Left SE Recess: clear No significant PND and no purulence  CPT CODE -- 68768 - Mod 25   ASSESSMENT:  68 y.o. F with:  1. Nasal congestion   2. Post-nasal drip    3. History of chronic sinusitis   4. Nasal obstruction    Has history of chronic sinusitis with prior FESS and septo/turbs but main concern today is post nasal drip. Rhinorrhea and congestion much better. PND also better but not all the way.  Night sx are bothersome but during day doing well.   She has tried astelin  but did not find it helpful, and using PO antihistamine and saline spray with some benefit.   She is now on H2, rinses, flonase  and atrovent  with benefit.  Overall, she is in a decent place nasally  PLAN: We've discussed issues and options today.  We reviewed the nasal endoscopy images together.  The risks, benefits and alternatives were discussed and questions answered.    1) Continue daily nasal rinses 2) Continue flonase  BID and atrovent  BID PRN (especially at night can consider) - can use atrovent  up to 4 times per day 3) Continue PO antihistamine and saline spray PRN 4) Switch from famotidine  to reflux gourmet Given improvement, opted PRN f/u  See below regarding exact medications prescribed this encounter including dosages and route: No orders of the defined types were placed in this encounter.    Thank you for allowing me the opportunity to care for your patient. Please do not hesitate to contact me should you have any other questions.  Sincerely, Eldora Blanch, MD Otolaryngologist (ENT), [facility] Phone: [phone] Fax: [phone]  I have personally spent 30 minutes involved in face-to-face and non-face-to-face activities for this patient on the day of the visit.  Professional time spent excludes any procedures performed but includes the following activities, in addition to those noted in the documentation: preparing to see the patient (review of outside documentation and results), performing a medically appropriate examination, counseling, documenting in the electronic health record     07/29/2024, 12:33 PM

## 2024-08-18 ENCOUNTER — Encounter: Payer: Self-pay | Admitting: Radiology

## 2024-08-18 ENCOUNTER — Encounter: Payer: Self-pay | Admitting: Family Medicine

## 2024-08-25 ENCOUNTER — Encounter: Payer: Self-pay | Admitting: Podiatry

## 2024-09-22 ENCOUNTER — Inpatient Hospital Stay: Admission: RE | Admit: 2024-09-22 | Discharge: 2024-09-22 | Attending: Family Medicine | Admitting: Family Medicine

## 2024-09-22 DIAGNOSIS — Z1231 Encounter for screening mammogram for malignant neoplasm of breast: Secondary | ICD-10-CM

## 2024-09-23 MED ORDER — FLUCONAZOLE 150 MG PO TABS: 150.0000 mg | ORAL_TABLET | ORAL | 0 refills | Status: AC

## 2024-09-24 ENCOUNTER — Encounter: Payer: Self-pay | Admitting: Family Medicine

## 2024-09-25 ENCOUNTER — Other Ambulatory Visit (HOSPITAL_BASED_OUTPATIENT_CLINIC_OR_DEPARTMENT_OTHER): Payer: Self-pay

## 2024-09-25 MED ORDER — AREXVY 120 MCG/0.5ML IM SUSR
INTRAMUSCULAR | 0 refills | Status: AC
  Filled 2024-09-25: qty 1, 1d supply, fill #0

## 2024-09-25 MED ORDER — RSV PRE-FUSION F A&B VAC RCMB 120 MCG/0.5ML IM SOLR
0.5000 mL | Freq: Once | INTRAMUSCULAR | 0 refills | Status: AC
  Filled 2024-09-25: qty 0.5, 1d supply, fill #0

## 2024-10-02 ENCOUNTER — Other Ambulatory Visit

## 2024-10-02 DIAGNOSIS — E785 Hyperlipidemia, unspecified: Secondary | ICD-10-CM

## 2024-10-02 LAB — COMPREHENSIVE METABOLIC PANEL WITH GFR
ALT: 17 U/L (ref 3–35)
AST: 19 U/L (ref 5–37)
Albumin: 4.4 g/dL (ref 3.5–5.2)
Alkaline Phosphatase: 38 U/L — ABNORMAL LOW (ref 39–117)
BUN: 19 mg/dL (ref 6–23)
CO2: 27 meq/L (ref 19–32)
Calcium: 9.8 mg/dL (ref 8.4–10.5)
Chloride: 106 meq/L (ref 96–112)
Creatinine, Ser: 0.75 mg/dL (ref 0.40–1.20)
GFR: 81.87 mL/min (ref >60.00)
Glucose, Bld: 91 mg/dL (ref 70–99)
Potassium: 4.2 meq/L (ref 3.5–5.1)
Sodium: 143 meq/L (ref 135–145)
Total Bilirubin: 0.6 mg/dL (ref 0.2–1.2)
Total Protein: 6.6 g/dL (ref 6.0–8.3)

## 2024-10-02 LAB — LIPID PANEL
Cholesterol: 199 mg/dL (ref 28–200)
HDL: 91.8 mg/dL (ref >39.00)
LDL Cholesterol: 92 mg/dL (ref 10–99)
NonHDL: 107.12
Total CHOL/HDL Ratio: 2
Triglycerides: 77 mg/dL (ref 10.0–149.0)
VLDL: 15.4 mg/dL (ref 0.0–40.0)

## 2024-10-06 ENCOUNTER — Ambulatory Visit: Payer: Self-pay | Admitting: Family Medicine

## 2025-06-16 ENCOUNTER — Ambulatory Visit: Admitting: Obstetrics and Gynecology

## 2025-06-17 ENCOUNTER — Ambulatory Visit: Admitting: Nurse Practitioner
